# Patient Record
Sex: Female | Born: 1990 | Race: White | Hispanic: No | Marital: Single | Smoking: Former smoker
Health system: Southern US, Community
[De-identification: ages and names within clinical notes are randomized; demographics above are authoritative.]

## PROBLEM LIST (undated history)

## (undated) ENCOUNTER — Inpatient Hospital Stay (HOSPITAL_COMMUNITY): Payer: Self-pay

## (undated) DIAGNOSIS — A499 Bacterial infection, unspecified: Secondary | ICD-10-CM

## (undated) DIAGNOSIS — Z789 Other specified health status: Secondary | ICD-10-CM

## (undated) HISTORY — DX: Bacterial infection, unspecified: A49.9

## (undated) HISTORY — PX: WISDOM TOOTH EXTRACTION: SHX21

## (undated) HISTORY — PX: NO PAST SURGERIES: SHX2092

---

## 2014-01-29 ENCOUNTER — Encounter (HOSPITAL_COMMUNITY): Payer: Self-pay | Admitting: Emergency Medicine

## 2014-01-29 ENCOUNTER — Emergency Department (HOSPITAL_COMMUNITY)
Admission: EM | Admit: 2014-01-29 | Discharge: 2014-01-29 | Disposition: A | Discharge status: Home / Self Care | Payer: BC Managed Care – PPO | Attending: Emergency Medicine | Admitting: Emergency Medicine

## 2014-01-29 ENCOUNTER — Emergency Department (HOSPITAL_COMMUNITY): Payer: BC Managed Care – PPO

## 2014-01-29 DIAGNOSIS — S99929A Unspecified injury of unspecified foot, initial encounter: Principal | ICD-10-CM

## 2014-01-29 DIAGNOSIS — M25561 Pain in right knee: Secondary | ICD-10-CM

## 2014-01-29 DIAGNOSIS — R296 Repeated falls: Secondary | ICD-10-CM | POA: Insufficient documentation

## 2014-01-29 DIAGNOSIS — Y92838 Other recreation area as the place of occurrence of the external cause: Secondary | ICD-10-CM

## 2014-01-29 DIAGNOSIS — Y9343 Activity, gymnastics: Secondary | ICD-10-CM | POA: Insufficient documentation

## 2014-01-29 DIAGNOSIS — Y9239 Other specified sports and athletic area as the place of occurrence of the external cause: Secondary | ICD-10-CM | POA: Insufficient documentation

## 2014-01-29 DIAGNOSIS — S8990XA Unspecified injury of unspecified lower leg, initial encounter: Secondary | ICD-10-CM | POA: Insufficient documentation

## 2014-01-29 DIAGNOSIS — Z3202 Encounter for pregnancy test, result negative: Secondary | ICD-10-CM | POA: Insufficient documentation

## 2014-01-29 DIAGNOSIS — S99919A Unspecified injury of unspecified ankle, initial encounter: Principal | ICD-10-CM

## 2014-01-29 LAB — POC URINE PREG, ED: Preg Test, Ur: NEGATIVE

## 2014-01-29 MED ORDER — HYDROCODONE-ACETAMINOPHEN 5-325 MG PO TABS: 1.0000 | ORAL_TABLET | Freq: Four times a day (QID) | ORAL | Status: DC | PRN

## 2014-01-29 MED ORDER — HYDROCODONE-ACETAMINOPHEN 5-325 MG PO TABS
1.0000 | ORAL_TABLET | Freq: Once | ORAL | Status: AC
  Administered 2014-01-29: 1 via ORAL
  Filled 2014-01-29: qty 1

## 2014-01-29 MED ORDER — IBUPROFEN 600 MG PO TABS: 600.0000 mg | ORAL_TABLET | Freq: Four times a day (QID) | ORAL | Status: DC | PRN

## 2014-01-29 NOTE — ED Provider Notes (Signed)
CSN: [id number]     Arrival date & time 01/29/14  1656 History  This chart was scribed for non-physician practitioner Raymon MuttonMarissa Sciacca, PA-C working with Gwyneth SproutWhitney Plunkett, MD by Joaquin MusicKristina Sanchez-Matthews, ED Scribe. This patient was seen in room TR11C/TR11C and the patient's care was started at 6:08 PM .   Chief Complaint  Patient presents with  . Knee Pain   The history is provided by the patient. No language interpreter was used.   HPI Comments: Tiffany Parsons is a 23 y.o. female who presents to the Emergency Department complaining of constant sharp R knee pain secondary to an injury that occurred yesterday. Pt states she was tumbling in gymnastics and landed wrong; states she landed on both feet but was on uneven surface and fell to her knees; she suspects her knee went outward and states "that is what happened last time". Unable to bear weight due to worsening pain and reports having sharp constant pain around and behind R knee. Hx of prior knee injury while cheerleading as a child; she tore R knee ligaments; denies surgery at the time. Reports having tingling sensation that has subsided. [facility]. Denies taking OTC medications, numbness, and warmth.  History reviewed. No pertinent past medical history. History reviewed. No pertinent past surgical history. No family history on file. History  Substance Use Topics  . Smoking status: Never Smoker   . Smokeless tobacco: Not on file  . Alcohol Use: Not on file   OB History   Grav Para Term Preterm Abortions TAB SAB Ect Mult Living                 Review of Systems  Musculoskeletal: Positive for arthralgias, joint swelling and myalgias.       Knee pain  Skin: Negative for color change, rash and wound.  Neurological: Negative for weakness and numbness.    Allergies  Review of patient's allergies indicates no known allergies.  Home Medications   Prior to Admission medications   Medication Sig Start Date End  Date Taking? Authorizing Mozetta Murfin  HYDROcodone-acetaminophen (NORCO/VICODIN) 5-325 MG per tablet Take 1 tablet by mouth every 6 (six) hours as needed for moderate pain or severe pain. 01/29/14   Marissa Sciacca, PA-C  ibuprofen (ADVIL,MOTRIN) 600 MG tablet Take 1 tablet (600 mg total) by mouth every 6 (six) hours as needed. 01/29/14   Marissa Sciacca, PA-C   BP 123/74  Pulse 104  Temp(Src) 98.2 F (36.8 C) (Oral)  Resp 18  Ht 5\' 7"  (1.702 m)  Wt 125 lb (56.7 kg)  BMI 19.57 kg/m2  SpO2 98%  LMP 01/09/2014  Physical Exam  Nursing note and vitals reviewed. Constitutional: She is oriented to person, place, and time. She appears well-developed and well-nourished. No distress.  HENT:  Head: Normocephalic and atraumatic.  Mouth/Throat: Oropharynx is clear and moist. No oropharyngeal exudate.  Eyes: Conjunctivae and EOM are normal. Right eye exhibits no discharge. Left eye exhibits no discharge.  Neck: Normal range of motion. Neck supple. No tracheal deviation present.  Cardiovascular: Normal rate, regular rhythm and normal heart sounds.  Exam reveals no friction rub.   No murmur heard. Pulses:      Radial pulses are 2+ on the right side, and 2+ on the left side.       Dorsalis pedis pulses are 2+ on the right side, and 2+ on the left side.  Mildly cold upon palpation to the feet bilaterally - negative changes of colors noted. Negative pallor  of skin. Negative cyanosis noted to the lower extremities.    Pulmonary/Chest: Effort normal and breath sounds normal. No respiratory distress. She has no wheezes. She has no rales.  Musculoskeletal: She exhibits edema and tenderness.       Right knee: She exhibits decreased range of motion. She exhibits no swelling, no effusion, no ecchymosis, no deformity, no laceration and no erythema. Tenderness found. LCL tenderness noted.       Legs: Mild swelling identified to the right knee with negative erythema, inflammation, lesions, sores, deformities  identified. Discomfort upon palpation to the lateral collateral ligament, lateral aspect and posterior aspect of the right knee. Diminished flexion secondary to pain. Patient is able to dorsiflex, plantar flex, invert and evert the right ankle without difficulty or ataxia.  Lymphadenopathy:    She has no cervical adenopathy.  Neurological: She is alert and oriented to person, place, and time. No cranial nerve deficit. She exhibits normal muscle tone. Coordination normal.  Cranial nerves III-XII grossly intact Strength 5+/5+ to lower extremities bilaterally with resistance applied, equal distribution noted Strength intact to the digits of the feet bilaterally - mildly diminished to the right side secondary to pain with motion  Sensation intact with differentiation to sharp and dull touch Patient not able to apply weight to the right lower extremity secondary to pain  Skin: Skin is warm and dry. No rash noted. She is not diaphoretic. No erythema.  Psychiatric: She has a normal mood and affect. Her behavior is normal. Thought content normal.    ED Course  Procedures (including critical care time) DIAGNOSTIC STUDIES: Oxygen Saturation is 98% on RA, normal by my interpretation.    COORDINATION OF CARE: 6:15 PM-Discussed treatment plan which includes UA and R knee CT. Will admister pain medication while in the ED if Preg Test is negative. Pt agreed to plan.   Results for orders placed during the hospital encounter of 01/29/14  POC URINE PREG, ED      Result Value Ref Range   Preg Test, Ur NEGATIVE  NEGATIVE   Labs Review Labs Reviewed  POC URINE PREG, ED   Imaging Review Dg Knee Complete 4 Views Right  01/29/2014   CLINICAL DATA:  Right-sided knee pain.  EXAM: RIGHT KNEE - COMPLETE 4+ VIEW  COMPARISON:  No priors.  FINDINGS: Multiple views of the right knee demonstrate no acute displaced fracture, subluxation, dislocation, or soft tissue abnormality.  IMPRESSION: No acute radiographic  abnormality of the right knee.   Electronically Signed   By: Trudie Reed M.D.   On: 01/29/2014 19:49     EKG Interpretation None     MDM   Final diagnoses:  Right knee pain    Medications  HYDROcodone-acetaminophen (NORCO/VICODIN) 5-325 MG per tablet 1 tablet (1 tablet Oral Given 01/29/14 1904)   Filed Vitals:   01/29/14 1716  BP: 123/74  Pulse: 104  Temp: 98.2 F (36.8 C)  TempSrc: Oral  Resp: 18  Height: 5\' 7"  (1.702 m)  Weight: 125 lb (56.7 kg)  SpO2: 98%   I personally performed the services described in this documentation, which was scribed in my presence. The recorded information has been reviewed and is accurate.  Urine pregnancy negative. Right knee plain films negative for acute displaced fracture, subluxation, dislocation or soft tissue abnormality. Discussed case with attending physician, Dr. Salley Scarlet, who recommended patient be placed in knee immobilizer and crutches, follow up with orthopedics as outpatient. Negative focal neurological deficits noted. Patient neurovascularly intact. Pulses  are palpable. Suspicion to be ligamental injury. Patient placed in knee immobilizer with crutches. Patient stable, afebrile. Discharged patient. Discharged patient with medications-discussed course, precautions, disposal technique. Referred patient to orthopedics. Discussed with patient to avoid any physical strenuous activity. Discussed with patient to closely monitor symptoms and if symptoms are to worsen or change to report back to the ED - strict return instructions given.  Patient agreed to plan of care, understood, all questions answered.   Raymon MuttonMarissa Sciacca, PA-C 01/30/14 0414  Raymon MuttonMarissa Sciacca, PA-C 01/30/14 1044

## 2014-01-29 NOTE — Discharge Instructions (Signed)
Please call your doctor for a followup appointment within 24-48 hours. When you talk to your doctor please let them know that you were seen in the emergency department and have them acquire all of your records so that they can discuss the findings with you and formulate a treatment plan to fully care for your new and ongoing problems. Please call and set up an appointment with your primary care provider Please call and set up an appointment with orthopedics-please call tomorrow morning Please keep knee immobilizer at all times-please to not bear weight-please use crutches Please rest, ice, elevate-toes above her nose Please take medications as prescribed-while on pain medications there is to be no drinking alcohol, driving, operating any heavy machinery if there is extra please dispose in a proper manner. Please do not take any extra Tylenol for this can lead to Tylenol overdose and liver issues. Please avoid any physical or strenuous activity Please continue to monitor symptoms closely and if symptoms are to worsen or change (fever greater than 101, chills, chest pain, shortness of breath, difficulty breathing, numbness, tingling, worsening or changes to pain pattern, fall, injury, hot to the touch, loss of sensation) please report back to the ED immediately  Ligament Sprain Ligaments are tough, fibrous tissues that hold bones together at the joints. A sprain can occur when a ligament is stretched. This injury may take several weeks to heal. HOME CARE INSTRUCTIONS   Rest the injured area for as long as directed by your caregiver. Then slowly start using the joint as directed by your caregiver and as the pain allows.  Keep the affected joint raised if possible to lessen swelling.  Apply ice for 15-20 minutes to the injured area every couple hours for the first half day, then 03-04 times per day for the first 48 hours. Put the ice in a plastic bag and place a towel between the bag of ice and your  skin.  Wear any splinting, casting, or elastic bandage applications as instructed.  Only take over-the-counter or prescription medicines for pain, discomfort, or fever as directed by your caregiver. Do not use aspirin immediately after the injury unless instructed by your caregiver. Aspirin can cause increased bleeding and bruising of the tissues.  If you were given crutches, continue to use them as instructed and do not resume weight bearing on the affected extremity until instructed. SEEK MEDICAL CARE IF:   Your bruising, swelling, or pain increases.  You have cold and numb fingers or toes if your arm or leg was injured. SEEK IMMEDIATE MEDICAL CARE IF:   Your toes are numb or blue if your leg was injured.  Your fingers are numb or blue if your arm was injured.  Your pain is not responding to medicines and continues to stay the same or gets worse. MAKE SURE YOU:   Understand these instructions.  Will watch your condition.  Will get help right away if you are not doing well or get worse. Document Released: 07/21/2000 Document Revised: 10/16/2011 Document Reviewed: 05/19/2008 [facility] Patient Information 2015 [place], [place]. This information is not intended to replace advice given to you by your health care provider. Make sure you discuss any questions you have with your health care provider.   Emergency Department Resource Guide 1) Find a Doctor and Pay Out of Pocket Although you won't have to find out who is covered by your insurance plan, it is a good idea to ask around and get recommendations. You will then need to call  the office and see if the doctor you have chosen will accept you as a new patient and what types of options they offer for patients who are self-pay. Some doctors offer discounts or will set up payment plans for their patients who do not have insurance, but you will need to ask so you aren't surprised when you get to your appointment.  2) Contact Your Local  Health Department Not all health departments have doctors that can see patients for sick visits, but many do, so it is worth a call to see if yours does. If you don't know where your local health department is, you can check in your phone book. The CDC also has a tool to help you locate your state's health department, and many state websites also have listings of all of their local health departments.  3) Find a Walk-in Clinic If your illness is not likely to be very severe or complicated, you may want to try a walk in clinic. These are popping up all over the country in pharmacies, drugstores, and shopping centers. They're usually staffed by nurse practitioners or physician assistants that have been trained to treat common illnesses and complaints. They're usually fairly quick and inexpensive. However, if you have serious medical issues or chronic medical problems, these are probably not your best option.  No Primary Care Doctor: - Call Health Connect at  [phone] - they can help you locate a primary care doctor that  accepts your insurance, provides certain services, etc. - Physician Referral Service- [phone]  Chronic Pain Problems: Organization         Address  Phone   Notes  [facility]  [phone] Patients need to be referred by their primary care doctor.   Medication Assistance: Organization         Address  Phone   Notes  [facility] Medication [facility] [address] [place], [place] [address] [phone]) [phone] --Must be a resident of [facility] [place] -- Must have NO insurance coverage whatsoever (no Medicaid/ Medicare, etc.) -- The pt. MUST have a primary care doctor that directs their care regularly and follows them in the community   MedAssist  [phone]   [employer]  [phone]    Agencies that provide inexpensive medical care: Organization         Address  Phone   Notes  [facility]  [phone]) [phone]    [facility]    [phone]) [phone]   [facility] [address] [place], [place] [address] [phone]) [phone]   [facility] [address], [place] [phone]   Planned Parenthood    [phone]   [facility]    [phone]   Community Health and [facility]  [address], [place] Phone:  [phone], Fax:  [phone] Hours of Operation:  9 am - 6 pm, M-F.  Also accepts Medicaid/Medicare and self-pay.  [facility]  [address], [place] Phone: [phone]) [phone], Fax: [phone]. Hours of Operation:  8:30 am - 5:30 pm, M-F.  Also accepts Medicaid and self-pay.  [facility] [place] [address], [place] Phone: [phone]) [phone]   [facility] [address], [place], [place] [phone], Ext. [phone] Mondays & Thursdays: 7-9 AM.  First 15 patients are seen on a first come, first serve basis.  [place] Providers:  Organization         Address  Phone   Notes  [facility] [address], [place] [phone] Also accepts self-pay patients.  [facility] [id number] [place], [place]  [phone]   [place], [address], [place] [phone]   [facility] [address] [place] [phone]   Lucianne Lei [address], [place]   [phone] Only accepts [place] Access [place] patients after they have their name applied to their card.   Self-Pay (no insurance) in [facility]:  Organization         Address  Phone   Notes  Sickle Cell Patients, [facility] Internal [facility] [place] [phone]   [facility] [place] [phone]   [facility] [place]  [place], [place],  [place] [phone]   [facility]/Dr. Osei-Bonsu  [address] [place] or [address], [place] [phone] Phone number for both [place] and [place] locations is the same.  Urgent Medical and [facility] [address] [place] [phone]   [facility] [address] [place] or [address] [phone]   [facility] [address] [place] [phone], phone; [phone], fax Sees patients 1st and 3rd Saturday of every month.  Must not qualify for public or private insurance (i.e. Medicaid, Medicare, [place] Health Choice, Veterans' Benefits)  Household income should be no more than 200% of the poverty level The clinic cannot treat you if you are pregnant or think you are pregnant  Sexually transmitted diseases are not treated at the clinic.    Dental Care: Organization         Address  Phone  Notes  [facility] of [facility] [place] [phone] Accepts children up to age 29 who are enrolled in [place] or [place]; pregnant women with a Medicaid card; and children who have applied for Medicaid or [place] Health Choice, but were declined, whose parents can pay a reduced fee at time of service.  [facility] Department of [facility]  [address], [place] [phone] Accepts children up to age 61 who are enrolled in [place] or [place]; pregnant women with a Medicaid card; and children who have applied for Medicaid or [place] Health Choice, but were declined, whose parents can pay a reduced fee at time of service.  [place] Adult Dental Access PROGRAM  [place] [phone] Patients are seen by appointment only. Walk-ins are not accepted. [place] will see patients 85 years of age and older. Monday - Tuesday (8am-5pm) Most Wednesdays  (8:30-5pm) $30 per visit, cash only  [facility] Adult Dental Access PROGRAM  [address], [facility] [phone] Patients are seen by appointment only. Walk-ins are not accepted. [place] will see patients 52 years of age and older. One Wednesday Evening (Monthly: Volunteer Based).  $30 per visit, cash only  [place]  [phone] for adults; Children under age 67, call Graduate Pediatric Dentistry at [phone]. Children aged 65-14, please call [phone] to request a  pediatric application.  Dental services are provided in all areas of dental care including fillings, crowns and bridges, complete and partial dentures, implants, gum treatment, root canals, and extractions. Preventive care is also provided. Treatment is provided to both adults and children. Patients are selected via a lottery and there is often a waiting list.   [facility] [address] [place]  [phone] [email or web address]   [facility] [address] [place], [place] [phone], Ext. [phone] Second and Fourth Thursday of each month, opens at 6:30 AM; Clinic ends at 9 AM.  Patients are seen on a first-come first-served basis, and a limited number are seen during each clinic.   [facility]  [address] Hillard Danker [place], [place] [phone]   Eligibility Requirements You must have lived in [place], [place], or [place] counties for at least the last three months.   You cannot be eligible for state or federal sponsored [employer], including [employer], [place], or [employer].   You generally cannot be eligible for healthcare insurance through your employer.    How to apply: Eligibility screenings are held every Tuesday and Wednesday afternoon from 1:00 pm until 4:00 pm. You do not need an appointment for the interview!  [facility] [address], [place], [place]   [place]  [place] Department  [place]  [phone]    Behavioral Health Resources in the Community: Intensive Outpatient Programs Organization         Address  Phone  Notes  [place]. [address], [place], [place] [phone]   [facility] Outpatient [address] [place], [place]   ADS: Alcohol & Drug Svcs [address] [place], [place]   [place] [address]  [place], [place] or [phone]   Substance Abuse Resources Organization         Address  Phone  Notes  Alcohol and Drug Services  [phone]   [place]  [phone]   The [place]   Chinita Pester  [phone]   Residential & Outpatient Substance Abuse Program  5166679660   Psychological Services Organization         Address  Phone  Notes  [facility]  [place]  [phone]   [place] [address], [place] or [phone]    Mobile Crisis Teams Organization         Address  Phone  Notes  [facility], Mobile Crisis Care Unit  [phone]   [facility] Psychotherapeutic Services  [address] [place], [place]   [place] Levels [address], [place] [phone]    Self-Help/Support Groups Organization         Address  Phone             Notes  [place]. of [place] - variety of support groups  [place] Call for more information  Narcotics Anonymous (NA), Caring [facility] [address], [employer] [place]  2 meetings at this location   Special educational needs teacher         Address  Phone  Notes  [facility] Residential Treatment [place],    [place]  [place]  [place], [place],  [place], [place]   [place], [place] [phone] Admissions: 8am-3pm M-F  Incentives Substance [place] [address],    [place], [place] [phone]   [facility] [address] [place], [place], [place]   [facility] [address]  [place], [place]   [facility]ight Programs - Intensive Outpatient [address], [place], [place]   [facility] ([facility] [address] [place].,  [place], [place] [phone] or [phone]   [facility]idential Treatment Services (RTS) [address], [place], [place] Accepts Medicaid  [facility] [place] [address],  [place] [phone] Substance Abuse/Addiction Treatment   [facility] Organization         Address  Phone  Notes  [facility]  [phone]   Domenic Schwab, PhD [address] Arlis Porta [place], [place]   [phone] or [phone]   Gandy Rotonda [place], [place] [phone]   [facility] [address] [place], [place] [phone] Insurance/Medicaid/sponsorship through [facility] and Families [address], [address] [place]                                    [place], [place] [phone] [place] [address] [place], [place] [phone]    Dr. Adele Schilder  [phone]   [facility] Dept. 1) [address] [place] 2) [place] 3)  [place] 65, [place] [phone]  [phone]   [place] [phone] or [phone] (After Hours)

## 2014-01-29 NOTE — ED Notes (Signed)
Pt instructed on crutch and knee immobilizer use.

## 2014-01-29 NOTE — ED Notes (Addendum)
Pt made a bad landing fm tumbling gymnastics event. Last night about 11pm. Pt states that it felt like her L knee twisted when she landed on grass after a "roundout". Initially able to walk  but pain progressivly worse until unable to bear weight at this time.  Pedal pulses weaker in R foot than L--- and L foot is cooler than Right

## 2014-01-30 NOTE — ED Provider Notes (Signed)
Medical screening examination/treatment/procedure(s) were performed by non-physician practitioner and as supervising physician I was immediately available for consultation/collaboration.   EKG Interpretation None        Gwyneth SproutWhitney Plunkett, MD 01/30/14 1400

## 2014-04-26 ENCOUNTER — Ambulatory Visit (INDEPENDENT_AMBULATORY_CARE_PROVIDER_SITE_OTHER): Payer: BC Managed Care – PPO | Admitting: Family Medicine

## 2014-04-26 VITALS — BP 124/82 | HR 75 | Temp 97.9°F | Resp 20 | Ht 66.0 in | Wt 129.4 lb

## 2014-04-26 DIAGNOSIS — M25559 Pain in unspecified hip: Secondary | ICD-10-CM

## 2014-04-26 DIAGNOSIS — R112 Nausea with vomiting, unspecified: Secondary | ICD-10-CM

## 2014-04-26 LAB — POCT WET PREP WITH KOH
KOH Prep POC: NEGATIVE
Trichomonas, UA: NEGATIVE
Yeast Wet Prep HPF POC: NEGATIVE

## 2014-04-26 MED ORDER — METRONIDAZOLE 500 MG PO TABS: 500.0000 mg | ORAL_TABLET | Freq: Three times a day (TID) | ORAL | Status: DC

## 2014-04-26 NOTE — Progress Notes (Addendum)
Patient ID: Tiffany Parsons MRN: [id number], DOB: 1991-05-06, 23 y.o. Date of Encounter: 04/26/2014, 12:32 PM  This chart was scribed for Dr. Elvina Sidle, MD by Jarvis Morgan, Medical Scribe. This patient was seen in Room 5 and the patient's care was started at 12:35 PM.  Primary Physician: No PCP Per Patient  Chief Complaint:  Chief Complaint  Patient presents with   Vaginitis   Nausea   Cyst    right wrist     HPI: 23 y.o. year old female with history below presents with an ongoing vaginal infection. Pt states that she has had bacterial vaginitis before and now it has returned. Her last infection was two months ago. Pt has previously been treated with Flagyl and states that it cleared up her symptoms. She is having associated nausea, vomiting, pelvic pain and difficulty sleeping due to pain. She believes it to be hormonal and due to her [facility] medication. She began it 3 months ago and states she is going to follow up with gynecologist to possibly change medications. She notes she stopped taking her BC medication yesterday. She is sexually active. She denies any new sexual partners. She denies any vaginal discharge, shortness of breath, chest pain, dysuria, frequency, dizziness, or fever.  Student at [employer]    History reviewed. No pertinent past medical history.   Home Meds: Prior to Admission medications   Medication Sig Start Date End Date Taking? Authorizing Provider  Norethin Ace-Eth Estrad-FE (MINASTRIN 24 FE) 1-20 MG-MCG(24) CHEW Chew 1 tablet by mouth daily.   Yes Historical Provider, MD    Allergies: No Known Allergies  History   Social History   Marital Status: Single    Spouse Name: N/A    Number of Children: N/A   Years of Education: N/A   Occupational History   Not on file.   Social History Main Topics   Smoking status: Never Smoker    Smokeless tobacco: Never Used   Alcohol Use: Yes   Drug Use: No   Sexual Activity: Not on  file   Other Topics Concern   Not on file   Social History Narrative   No narrative on file     Review of Systems: Constitutional: negative for chills, fever, night sweats, weight changes, or fatigue  HEENT: negative for vision changes, hearing loss, congestion, rhinorrhea, ST, epistaxis,cough, or sinus pressure Cardiovascular: negative for chest pain or palpitations Respiratory: negative for hemoptysis, wheezing, shortness of breath, or cough Abdominal: negative for abdominal pain, diarrhea, or constipation. Positive for nausea or vomiting Dermatological: negative for rash Neurologic: negative for headache, dizziness, or syncope GU: negative for hematuria, frequency, dysuria or vaginal discharge. Positive for pelvic pain. All other systems reviewed and are otherwise negative with the exception to those above and in the HPI.   Physical Exam: Blood pressure 124/82, pulse 75, temperature 97.9 F (36.6 C), temperature source Oral, resp. rate 20, height  (1.676 m), weight 129 lb 6 oz (58.684 kg), last menstrual period 04/05/2014, SpO2 100.00%., Body mass index is 20.89 kg/(m^2). General: Well developed, well nourished, in no acute distress. Head: Normocephalic, atraumatic, eyes without discharge, sclera non-icteric, nares are without discharge. Bilateral auditory canals clear, TM's are without perforation, pearly grey and translucent with reflective cone of light bilaterally. Oral cavity moist, posterior pharynx without exudate, erythema, peritonsillar abscess, or post nasal drip.  Neck: Supple. No thyromegaly. Full ROM. No lymphadenopathy. Lungs: Clear bilaterally to auscultation without wheezes, rales, or rhonchi. Breathing is unlabored.  Heart: RRR with S1 S2. No murmurs, rubs, or gallops appreciated. Abdomen: Soft, non-tender, non-distended with normoactive bowel sounds. No hepatomegaly. No rebound/guarding. No obvious abdominal masses. Pelvic:  NEFG, bloody thick mucoid discharge  from os with friable os.  Mildly tender UTX, no adenxal pain  Msk:  Strength and tone normal for age. Extremities/Skin: Warm and dry. No clubbing or cyanosis. No edema. No rashes or suspicious lesions. Neuro: Alert and oriented X 3. Moves all extremities spontaneously. Gait is normal. CNII-XII grossly in tact. Psych:  Responds to questions appropriately with a normal affect.   Labs: Results for orders placed in visit on 04/26/14  POCT WET PREP WITH KOH      Result Value Ref Range   Trichomonas, UA Negative     Clue Cells Wet Prep HPF POC 3-5     Epithelial Wet Prep HPF POC 2-4     Yeast Wet Prep HPF POC neg     Bacteria Wet Prep HPF POC 2+     RBC Wet Prep HPF POC 0-2     WBC Wet Prep HPF POC 3-6     KOH Prep POC Negative      ASSESSMENT AND PLAN:  23 y.o. year old female with  Pain in joint, pelvic region and thigh, unspecified laterality - Plan: POCT Wet Prep with KOH, GC/Chlamydia Amp Probe, Genital, metroNIDAZOLE (FLAGYL) 500 MG tablet  Nausea and vomiting, vomiting of unspecified type - Plan: metroNIDAZOLE (FLAGYL) 500 MG tablet   Signed, Elvina Sidle, MD 04/26/2014 12:32 PM

## 2014-04-26 NOTE — Patient Instructions (Signed)

## 2014-04-28 LAB — GC/CHLAMYDIA PROBE AMP
CT Probe RNA: NEGATIVE
GC Probe RNA: NEGATIVE

## 2014-05-12 ENCOUNTER — Ambulatory Visit (INDEPENDENT_AMBULATORY_CARE_PROVIDER_SITE_OTHER): Payer: BC Managed Care – PPO | Admitting: Family Medicine

## 2014-05-12 ENCOUNTER — Encounter: Payer: Self-pay | Admitting: Family Medicine

## 2014-05-12 VITALS — BP 108/80 | HR 67 | Temp 98.3°F | Ht 66.75 in | Wt 129.0 lb

## 2014-05-12 DIAGNOSIS — Z7189 Other specified counseling: Secondary | ICD-10-CM

## 2014-05-12 DIAGNOSIS — A499 Bacterial infection, unspecified: Secondary | ICD-10-CM

## 2014-05-12 DIAGNOSIS — N760 Acute vaginitis: Secondary | ICD-10-CM

## 2014-05-12 DIAGNOSIS — Z7689 Persons encountering health services in other specified circumstances: Secondary | ICD-10-CM

## 2014-05-12 DIAGNOSIS — M67431 Ganglion, right wrist: Secondary | ICD-10-CM

## 2014-05-12 DIAGNOSIS — B9689 Other specified bacterial agents as the cause of diseases classified elsewhere: Secondary | ICD-10-CM

## 2014-05-12 MED ORDER — METRONIDAZOLE 0.75 % VA GEL: 1.0000 | Freq: Two times a day (BID) | VAGINAL | Status: DC

## 2014-05-12 NOTE — Progress Notes (Signed)
No chief complaint on file.   HPI:  Tiffany Parsons is here to establish care.  Last PCP and physical: has a gynecologist  Has the following chronic problems and concerns today:  There are no active problems to display for this patient.  BV: -dx at urgent care a few weeks ago -had neg testing for everything else, has mild irritation, no discharge -denies: fevers, NVD, pelvic or vag pain, irr bleeding, new sexual partner -she took a few of the flagyl but did not repeat course -FDLMP: 2 weeks  Cyst on R wrist: -started bothering her a few weeks ago -pain in this area at times, bumps it on things  ROS negative for unless reported above: fevers, unintentional weight loss, hearing or vision loss, chest pain, palpitations, struggling to breath, hemoptysis, melena, hematochezia, hematuria, falls, loc, si, thoughts of self harm  Past Medical History  Diagnosis Date  . Bacterial infection     Family History  Problem Relation Age of Onset  . Hyperlipidemia Mother     History   Social History  . Marital Status: Single    Spouse Name: N/A    Number of Children: N/A  . Years of Education: N/A   Social History Main Topics  . Smoking status: Former Games developermoker  . Smokeless tobacco: Never Used     Comment: smoked lightly in the past  . Alcohol Use: Yes     Comment: 5 drinks; 2-3 times per week  . Drug Use: No  . Sexual Activity: None   Other Topics Concern  . None   Social History Narrative   Work or School: works at [employer]; [employer]      Home Situation: lives with [place]      Spiritual Beliefs: Christian      Lifestyle: walks a lot; diet is healthy             Current outpatient prescriptions:metroNIDAZOLE (METROGEL VAGINAL) 0.75 % vaginal gel, Place 1 Applicatorful vaginally 2 (two) times daily., Disp: 70 g, Rfl: 0  EXAM:  Filed Vitals:   05/12/14 0923  BP: 108/80  Pulse: 67  Temp: 98.3 F (36.8 C)    Body mass index is 20.37  kg/(m^2).  GENERAL: vitals reviewed and listed above, alert, oriented, appears well hydrated and in no acute distress  HEENT: atraumatic, conjunttiva clear, no obvious abnormalities on inspection of external nose and ears  NECK: no obvious masses on inspection  LUNGS: clear to auscultation bilaterally, no wheezes, rales or rhonchi, good air movement  CV: HRRR, no peripheral edema  MS: moves all extremities without noticeable abnormality Subcutaneous nodule on R flexor wrist.  PSYCH: pleasant and cooperative, no obvious depression or anxiety  ASSESSMENT AND PLAN:  Discussed the following assessment and plan:  Ganglion cyst of wrist, right -likely symptomatic ganglion cyst -discussed options and she would like to see specialist to attempt aspiration, surgical treatment if recurs -referral placed  Encounter to establish care  Bacterial vaginitis -likely, discussed likely BV and other potential etiologies -she declined repeat pelvic exam today -she opted for empiric PV tx given recent negative testing for other etiologies, follow up if persists  -We reviewed the PMH, PSH, FH, SH, Meds and Allergies. -We provided refills for any medications we will prescribe as needed. -We addressed current concerns per orders and patient instructions. -We have asked for records for pertinent exams, studies, vaccines and notes from previous providers. -We have advised patient to follow up per instructions below.   -Patient  advised to return or notify a doctor immediately if symptoms worsen or persist or new concerns arise.  Patient Instructions  -We placed a referral for you as discussed to the Sports Medicine doctor for your wrist pain. It usually takes about 1-2 weeks to process and schedule this referral. If you have not heard from [place] regarding this appointment in 2 weeks please contact our office.  -Please use the metronidazole gel twice daily for 1 week. Follow up if any persistent  symptoms.      Kriste Basque R.

## 2014-05-12 NOTE — Progress Notes (Signed)
Pre visit review using our clinic review tool, if applicable. No additional management support is needed unless otherwise documented below in the visit note. 

## 2014-05-12 NOTE — Patient Instructions (Signed)
-  We placed a referral for you as discussed to the Sports Medicine doctor for your wrist pain. It usually takes about 1-2 weeks to process and schedule this referral. If you have not heard from us regarding this appointment in 2 weeks please contact our office.  -Please use the metronidazole gel twice daily for 1 week. Follow up if any persistent symptoms.

## 2014-05-19 ENCOUNTER — Ambulatory Visit (INDEPENDENT_AMBULATORY_CARE_PROVIDER_SITE_OTHER): Payer: BC Managed Care – PPO | Admitting: Family Medicine

## 2014-05-19 ENCOUNTER — Encounter: Payer: Self-pay | Admitting: Family Medicine

## 2014-05-19 ENCOUNTER — Other Ambulatory Visit (INDEPENDENT_AMBULATORY_CARE_PROVIDER_SITE_OTHER): Payer: BC Managed Care – PPO

## 2014-05-19 VITALS — BP 110/72 | HR 75 | Ht 66.0 in | Wt 129.0 lb

## 2014-05-19 DIAGNOSIS — M25531 Pain in right wrist: Secondary | ICD-10-CM

## 2014-05-19 DIAGNOSIS — M67439 Ganglion, unspecified wrist: Secondary | ICD-10-CM | POA: Insufficient documentation

## 2014-05-19 DIAGNOSIS — M67431 Ganglion, right wrist: Secondary | ICD-10-CM

## 2014-05-19 NOTE — Patient Instructions (Signed)
Good to meet you Ibuprofen 600mg  in 6 hours.  Iece 20 minutest at the end of the day sn May come back but hopefully not for a very long time.  Checkin in 2-3 weeks.

## 2014-05-19 NOTE — Progress Notes (Signed)
Tawana ScaleZach Brayley Mackowiak D.O. [facility] [address] [place], [place] [address] Phone: [phone] Subjective:    I'm seeing this patient by the request  of:  Kriste BasqueKIM, HANNAH R., DO   CC: Right wrist cyst  KVQ:[id number]:Subjective Tiffany Parsons is a 23 y.o. female coming in with complaint of cyst on her right wrist. Patient was seen by primary care provider who sent her here for further evaluation. Patient states that she has had this from multiple years but now it seems to be giving her aggravation. Patient states it is on the volar aspect of the wrist. Worse when she does repetitive activity. Denies any fevers chills or any abnormal weight loss. Denies any weakness or numbness in the fingers. States that the severity is 5/10. Patient would like it removed if possible. No nighttime awakening.     Past medical history, social, surgical and family history all reviewed in electronic medical record.   Review of Systems: No headache, visual changes, nausea, vomiting, diarrhea, constipation, dizziness, abdominal pain, skin rash, fevers, chills, night sweats, weight loss, swollen lymph nodes, body aches, joint swelling, muscle aches, chest pain, shortness of breath, mood changes.   Objective Blood pressure 110/72, pulse 75, height 5\' 6"  (1.676 m), weight 129 lb (58.514 kg), last menstrual period 04/29/2014, SpO2 99.00%.  General: No apparent distress alert and oriented x3 mood and affect normal, dressed appropriately.  HEENT: Pupils equal, extraocular movements intact  Respiratory: Patient's speak in full sentences and does not appear short of breath  Cardiovascular: No lower extremity edema, non tender, no erythema  Skin: Warm dry intact with no signs of infection or rash on extremities or on axial skeleton.  Abdomen: Soft nontender  Neuro: Cranial nerves II through XII are intact, neurovascularly intact in all extremities with 2+ DTRs and 2+ pulses.  Lymph: No lymphadenopathy of posterior or anterior  cervical chain or axillae bilaterally.  Gait normal with good balance and coordination.  MSK:  Non tender with full range of motion and good stability and symmetric strength and tone of shoulders, elbows, wrist, hip, knee and ankles bilaterally.  Patient hand exam shows the patient does have a ganglion cyst right over the lunate bone on the volar aspect of the wrist. Full grip strength and neurovascularly intact packed distally. Full range of motion of the wrist. Symmetric to contralateral side.  MSK US performed of: Right wrist This study was ordered, performed, and interpreted by Terrilee FilesZach Erick Oxendine D.O.  Wrist: All extensor compartments visualized and tendons all normal in appearance without fraying, tears, or sheath effusions. No effusion seen. TFCC intact. Scapholunate ligament intact. Patient does have what appears to be a ganglion cyst definitely 0.4 cm in diameter. Carpal tunnel visualized and median nerve area normal, flexor tendons all normal in appearance without fraying, tears, or sheath effusions. Power doppler signal normal.  IMPRESSION:  Ganglion cyst  Procedure: Real-time Ultrasound Guided aspiration and Injection of right volar ganglion cyst Device: GE Logiq E  Ultrasound guided injection is preferred based studies that show increased duration, increased effect, greater accuracy, decreased procedural pain, increased response rate, and decreased cost with ultrasound guided versus blind injection.  Verbal informed consent obtained.  Time-out conducted.  Noted no overlying erythema, induration, or other signs of local infection.  Skin prepped in a sterile fashion.  Local anesthesia: Topical Ethyl chloride.  With sterile technique and under real time ultrasound guidance:  With a 25-gauge 1 inch needle was injected with 1 cc of 0.5% Marcaine. Patient then  with pressure did have significant gel-like fluid removed from the ganglion cyst. Patient then had 0.5 cc of Kenalog 40 mg/dL  injected into the cyst itself. Patient tolerated the procedure well with minimal blood loss. Completed without difficulty  Pain immediately resolved suggesting accurate placement of the medication.  Advised to call if fevers/chills, erythema, induration, drainage, or persistent bleeding.  Images permanently stored and available for review in the ultrasound unit.  Impression: Technically successful ultrasound guided injection.     Impression and Recommendations:     This case required medical decision making of moderate complexity.

## 2014-05-19 NOTE — Assessment & Plan Note (Signed)
Patient had successful aspiration and injection done today. We discussed that 50% of these like to reaccumulate over time. We discussed an icing regimen as well as monitoring for any reaccumulation or any signs of infection. Patient likely will do very well though. We discussed taking coming back again in 2 weeks for further evaluation to make sure everything is healing appropriately. The patient otherwise can come back and see me again if any reaccumulation occurs we can attempt aspiration again.

## 2015-04-13 ENCOUNTER — Ambulatory Visit (INDEPENDENT_AMBULATORY_CARE_PROVIDER_SITE_OTHER): Payer: BC Managed Care – PPO | Admitting: Family Medicine

## 2015-04-13 ENCOUNTER — Encounter: Payer: Self-pay | Admitting: Family Medicine

## 2015-04-13 VITALS — BP 112/80 | HR 82 | Temp 97.8°F | Ht 66.0 in | Wt 126.6 lb

## 2015-04-13 DIAGNOSIS — F411 Generalized anxiety disorder: Secondary | ICD-10-CM | POA: Diagnosis not present

## 2015-04-13 DIAGNOSIS — F331 Major depressive disorder, recurrent, moderate: Secondary | ICD-10-CM | POA: Diagnosis not present

## 2015-04-13 DIAGNOSIS — F410 Panic disorder [episodic paroxysmal anxiety] without agoraphobia: Secondary | ICD-10-CM | POA: Diagnosis not present

## 2015-04-13 MED ORDER — PAROXETINE HCL 20 MG PO TABS: 20.0000 mg | ORAL_TABLET | Freq: Every day | ORAL | Status: DC

## 2015-04-13 MED ORDER — LORAZEPAM 0.5 MG PO TABS: 0.5000 mg | ORAL_TABLET | Freq: Two times a day (BID) | ORAL | Status: DC | PRN

## 2015-04-13 NOTE — Progress Notes (Addendum)
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  -reports uses xanax 1/2 tablet (unsure of dose) every other day and feels when she stops it panic attacks are worse and she feels like she is addicted -reports she finally told her father about this and he told her to see me for help  -reports she wants to get off xanax  -denies: SI, thoughts of self harm, manic symptoms now or in the past -reports on lexapro in the past, but did not like the way it made her feel -denies regular alcohol use or other drug use - drinks some on weekends -denies pregnancy, [facility] 16t[facility]  ROS: See pertinent positives and negatives per HPI.  Past Medical History  Diagnosis Date  . Bacterial infection     Past Surgical History  Procedure Laterality Date  . Wisdom tooth extraction      Family History  Problem Relation Age of Onset  . Hyperlipidemia Mother     Social History   Social History  . Marital Status: Single    Spouse Name: N/A  . Number of Children: N/A  . Years of Education: N/A   Social History Main Topics  . Smoking status: Former Games developer  . Smokeless tobacco: Never Used     Comment: smoked lightly in the past  . Alcohol Use: Yes     Comment: 5 drinks; 2-3 times per week  . Drug Use: No  . Sexual Activity: Not Asked   Other Topics Concern  . None   Social History Narrative   Work or School: works at [employer]; [employer] Situation: lives with [place]      Spiritual  Beliefs: Christian      Lifestyle: walks a lot; diet is healthy              Current outpatient prescriptions:  .  LORazepam (ATIVAN) 0.5 MG tablet, Take 1 tablet (0.5 mg total) by mouth every 12 (twelve) hours as needed for anxiety (panic attack). Try to use as little as possible., Disp: 15 tablet, Rfl: 0 .  PARoxetine (PAXIL) 20 MG tablet, Take 1 tablet (20 mg total) by mouth daily., Disp: 30 tablet, Rfl: 3  EXAM:  Filed Vitals:   04/13/15 1340  BP: 112/80  Pulse: 82  Temp: 97.8 F (36.6 C)    Body mass index is 20.44 kg/(m^2).  GENERAL: vitals reviewed and listed above, alert, oriented, appears well hydrated and in no acute distress  HEENT: atraumatic, conjunttiva clear, no obvious abnormalities on inspection of external nose and ears  NECK: no obvious masses on inspection  LUNGS: clear to auscultation bilaterally, no wheezes, rales or rhonchi, good air movement  CV: HRRR, no peripheral edema  MS: moves all extremities without noticeable abnormality  PSYCH: pleasant and cooperative, no obvious depression or anxiety  ASSESSMENT AND PLAN:  Discussed the following assessment and plan:  MDD (major depressive disorder), recurrent episode, moderate  Generalized anxiety disorder  Panic disorder  -discussed options -opted for CBT, add  paxil for MDD, GAD, Panic disorder -cont low dose benzo for now but change to ativan - advised I DO NOT prescribe this for long term use -counseled on risks, interactions, interactions with alcohol and advised not to use alcohol with benzos -she wants to try to get off and once doing ok on Paxil and CBT will attempt slow taper -she agrees to see psych if any worsening, difficulty getting of benzo or not improving -Patient advised to return or notify a doctor immediately if symptoms worsen or persist or new concerns arise.  Patient Instructions  BEFORE YOU LEAVE: -schedule follow up in 1 month  Call today to schedule an appointment  for counseling.  Start the Paxil - once daily today. Do not stop this medication suddenly.  Stop taking xanax. Please use the Ativan instead only per current xanax use and/or as needed for panic. We will plan to slowly taper this medication once you are feeling better.  You have decided to try a medication that is a controlled substance (Ativan) for treatment of your medical problem. While this medication has benefits and can help you with your illness, as we discussed, it also has considerable risks. You have been properly informed of the risks and alternatives and re-iterate here that this medication can cause:  Altered Mental Capacity or Alertness: Do not drive or operate machinery while taking this medication  Dependence: A physiological state that can occur with regular drug use and results in withdrawal symptoms when drug use is abruptly discontinued. This can happen even with a short course of this medication.  Addiction: A chronic, relapsing disease characterized by compulsive drug-seeking and use despite negative consequences and by long-lasting changes in the brain.  Many, many more side effects including some that can be serious and/or life threatening are possible with improper use. Please discuss these side effects with your pharmacist.  I advise: -that you use as little of this medication as possible and only as directed for as short of time as possible -keep this medication in a safe and locked location away from children or others -do not share this medication -dispose of any unused medication in a medication drop box -do not take this medication with other controlled or sedating substances, alcohol or drugs other then those approved by your doctor       Kriste Basque R.

## 2015-04-13 NOTE — Patient Instructions (Signed)
BEFORE YOU LEAVE: -schedule follow up in 1 month  Call today to schedule an appointment for counseling.  Start the Paxil - once daily today. Do not stop this medication suddenly.  Stop taking xanax. Please use the Ativan instead only per current xanax use and/or as needed for panic. We will plan to slowly taper this medication once you are feeling better.  You have decided to try a medication that is a controlled substance (Ativan) for treatment of your medical problem. While this medication has benefits and can help you with your illness, as we discussed, it also has considerable risks. You have been properly informed of the risks and alternatives and re-iterate here that this medication can cause:  Altered Mental Capacity or Alertness: Do not drive or operate machinery while taking this medication  Dependence: A physiological state that can occur with regular drug use and results in withdrawal symptoms when drug use is abruptly discontinued. This can happen even with a short course of this medication.  Addiction: A chronic, relapsing disease characterized by compulsive drug-seeking and use despite negative consequences and by long-lasting changes in the brain.  Many, many more side effects including some that can be serious and/or life threatening are possible with improper use. Please discuss these side effects with your pharmacist.  I advise: -that you use as little of this medication as possible and only as directed for as short of time as possible -keep this medication in a safe and locked location away from children or others -do not share this medication -dispose of any unused medication in a medication drop box -do not take this medication with other controlled or sedating substances, alcohol or drugs other then those approved by your doctor

## 2015-04-13 NOTE — Progress Notes (Signed)
Pre visit review using our clinic review tool, if applicable. No additional management support is needed unless otherwise documented below in the visit note. 

## 2015-05-13 ENCOUNTER — Ambulatory Visit: Payer: BC Managed Care – PPO | Admitting: Family Medicine

## 2015-12-08 ENCOUNTER — Telehealth: Payer: Self-pay | Admitting: *Deleted

## 2015-12-08 NOTE — Telephone Encounter (Signed)
Attempted to contact patient again to call office back.

## 2015-12-08 NOTE — Telephone Encounter (Signed)
Agree. Needs urgent evaluation in ER and then psychiatry/rehab help after.

## 2015-12-08 NOTE — Telephone Encounter (Signed)
Patient has yet to arrive to ED (atleast not a [place] ED). Left voicemail to call office back. Will recommend patient go to [facility] ED for medical clearance and evaluation. --------------------------------------------------------------------------------[facility]--------------------------------------------------------------------------------- PLEASE NOTE: All timestamps contained within this report are represented as [place] Standard Time. CONFIDENTIALTY NOTICE: This fax transmission is intended only for the addressee. It contains information that is legally privileged, confidential or otherwise protected from use or disclosure. If you are not the intended recipient, you are strictly prohibited from reviewing, disclosing, copying using or disseminating any of this information or taking any action in reliance on or regarding this information. If you have received this fax in error, please notify us immediately by telephone so that we can arrange for its return to us. Phone: [phone], Toll-Free: [phone], Fax: [phone] Page: 1 of 2 Call Id: [id number] [facility] Night - Client TELEPHONE ADVICE RECORD [facility] Patient Name: Tiffany DevoidMILY Melchior Gender: Female DOB: 07/06/1991 Age: 25 Y 7 M 24 D Return Phone Number: [phone] (Primary) Address: City/State/Zip: [place] Client [facility] Night - Client Client Site [facility] - Night Physician Kriste BasqueKim, Hannah - MD Contact Type Call Who Is Calling Patient / Member / Family / Caregiver Call Type Triage / Clinical Caller Name Graylon GoodBilly Vanaken Relationship To Patient Father Return Phone Number [phone] (Primary) Chief Complaint Substance Abuse And Dependence Reason for Call Symptomatic / Request for Health Information Initial Comment Caller states my dtr is having withdraw from alcohol and zanex she needs to be put in a program PreDisposition Go to ED Translation  No Nurse Assessment Nurse: Orvis Brillowland, RN, Olegario MessierKathy Date/Time (Eastern Time): 12/07/2015 5:26:18 PM Confirm and document reason for call. If symptomatic, describe symptoms. You must click the next button to save text entered. ---Caller states that his dtr is withdrawing from ETOH & Xanaxes, was seen in ED in [place] on Sunday night because pt was having seizures without any history of them, Early Monday AM ED gave pt 2 Xanaxes. Last drank ETOH on Sunday. Caller states that his dtr is wanting to get into a drug recovery program. Caller states that his dtr has been using Xanaxes for past 3 yrs & has been drinking daily "for a while". Has the patient traveled out of the country within the last 30 days? ---No Does the patient have any new or worsening symptoms? ---Yes Will a triage be completed? ---Yes Related visit to physician within the last 2 weeks? ---Yes Does the PT have any chronic conditions? (i.e. diabetes, asthma, etc.) ---Yes List chronic conditions. ---polysubstance abuse Is the patient pregnant or possibly pregnant? (Ask all females between the ages of 512-55) ---No Is this a behavioral health or substance abuse call? ---Yes Are you having any thoughts or feelings of harming or killing yourself or someone else? ---No Are you currently experiencing any physical discomfort that you think may be related to the use of alcohol or other drugs? ---Yes PLEASE NOTE: All timestamps contained within this report are represented as [place] Standard Time. CONFIDENTIALTY NOTICE: This fax transmission is intended only for the addressee. It contains information that is legally privileged, confidential or otherwise protected from use or disclosure. If you are not the intended recipient, you are strictly prohibited from reviewing, disclosing, copying using or disseminating any of this information or taking any action in reliance on or regarding this information. If you have received this fax in  error, please notify us immediately by telephone so that we can arrange  for its return to [place]. Phone: [phone], Toll-Free: [phone], Fax: [phone] Page: 2 of 2 Call Id: [id number] Nurse Assessment (use substance abuse or alcohol abuse guidelines. These include withdrawal symptoms) Do you worry that you may be hearing or seeing things that others do not? ---No Do you take medications for your condition(s)? ---No Guidelines Guideline Title Affirmed Question Affirmed Notes Nurse Date/Time Lamount Cohen Time) Substance Abuse and Dependence Bizarre, paranoid or confused behavior Pearline Cables 12/07/2015 5:30:27 PM Disp. Time Lamount Cohen Time) Disposition Final User 12/07/2015 5:34:09 PM Go to ED Now Yes Orvis Brill, RN, Sammuel Bailiff Understands: Yes Disagree/Comply: Comply Care Advice Given Per Guideline GO TO ED NOW: You need to be seen in the Emergency Department. Go to the ER at ___________ Hospital. Leave now. Drive carefully. BRING MEDICINES: * Please bring a list of your current medicines when you go to the Emergency Department (ER). * It is also a good idea to bring the pill bottles too. This will help the doctor to make certain you are taking the right medicines and the right dose. DRIVING: Another adult should drive. CARE ADVICE given per Substance Abuse and Dependence (Adult) guideline. Comments User: Almira Bar, RN Date/Time (Eastern Time): 12/07/2015 5:36:11 PM Caller advised that he will also need to call his insurance provider to determine what rehab facilities his insurance covers. Caller states that his dtr advised him that she doesn't think that she needs to go back to ED but caller advised that since his dtr is also withdrawing from ETOH & has H/O drinking daily, she is also at high risk for having ETOH withdrawal seizures. Referrals [facility] - ED

## 2015-12-13 NOTE — Telephone Encounter (Signed)
Attempted to call patient and message on voicemail was that mail box is full.  Will have to try and call patient later.

## 2015-12-14 NOTE — Telephone Encounter (Signed)
Tried to contact patient no voicemail, tried to call mother no voicemail, left message on Tiffany Parsons (fathers) phone.

## 2016-06-29 ENCOUNTER — Encounter (HOSPITAL_COMMUNITY): Payer: Self-pay | Admitting: *Deleted

## 2016-06-29 ENCOUNTER — Emergency Department (HOSPITAL_COMMUNITY)
Admission: EM | Admit: 2016-06-29 | Discharge: 2016-06-29 | Disposition: A | Discharge status: Home / Self Care | Payer: BC Managed Care – PPO | Attending: Dermatology | Admitting: Dermatology

## 2016-06-29 DIAGNOSIS — F10129 Alcohol abuse with intoxication, unspecified: Secondary | ICD-10-CM | POA: Insufficient documentation

## 2016-06-29 DIAGNOSIS — Z5321 Procedure and treatment not carried out due to patient leaving prior to being seen by health care provider: Secondary | ICD-10-CM | POA: Diagnosis not present
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
## 2016-06-29 NOTE — ED Notes (Signed)
Pt stated there was nothing I could do to convince her to stay. I in formed Pt that she had a head laceration, and that she may have unknown head trauma, and she is at critical risk. I informed pt that death is a possibility pt stated "I don't believe that", "i still want to leave", and "i'm a medical student, and I want to leave."

## 2016-07-25 ENCOUNTER — Ambulatory Visit: Payer: BC Managed Care – PPO | Admitting: Family Medicine

## 2017-03-21 ENCOUNTER — Encounter: Payer: Self-pay | Admitting: Family Medicine

## 2017-03-21 ENCOUNTER — Ambulatory Visit (INDEPENDENT_AMBULATORY_CARE_PROVIDER_SITE_OTHER): Payer: BC Managed Care – PPO | Admitting: Family Medicine

## 2017-03-21 VITALS — BP 124/88 | HR 88 | Temp 98.7°F | Wt 139.1 lb

## 2017-03-21 DIAGNOSIS — R300 Dysuria: Secondary | ICD-10-CM | POA: Diagnosis not present

## 2017-03-21 LAB — POCT URINALYSIS DIPSTICK
BILIRUBIN UA: NEGATIVE
Glucose, UA: NEGATIVE
Ketones, UA: NEGATIVE
Nitrite, UA: NEGATIVE
PH UA: 8 (ref 5.0–8.0)
Protein, UA: POSITIVE
Spec Grav, UA: 1.01 (ref 1.010–1.025)
Urobilinogen, UA: 0.2 E.U./dL

## 2017-03-21 MED ORDER — NITROFURANTOIN MONOHYD MACRO 100 MG PO CAPS: 100.0000 mg | ORAL_CAPSULE | Freq: Two times a day (BID) | ORAL | 0 refills | Status: DC

## 2017-03-21 NOTE — Progress Notes (Signed)
Subjective:     Patient ID: Tiffany Parsons, female   DOB: 07/25/1991, 26 y.o.   MRN: [id number]  HPI Patient seen with five-day history of dysuria. She's noted mostly strong odor. She's had some very mild burning with urination but not to the severity she's noted in the past. She's had one previous ear infection. Denies any frequency. No fevers or chills. No flank pain. No nausea or vomiting. No known drug allergies.  Past Medical History:  Diagnosis Date  . Bacterial infection    Past Surgical History:  Procedure Laterality Date  . WISDOM TOOTH EXTRACTION      reports that she has quit smoking. She has never used smokeless tobacco. She reports that she drinks alcohol. She reports that she does not use drugs. family history includes Hyperlipidemia in her mother. No Known Allergies    Review of Systems  Constitutional: Negative for appetite change, chills and fever.  Gastrointestinal: Negative for abdominal pain, constipation, diarrhea, nausea and vomiting.  Genitourinary: Positive for dysuria and frequency.  Musculoskeletal: Negative for back pain.  Neurological: Negative for dizziness.       Objective:   Physical Exam  Constitutional: She appears well-developed and well-nourished.  HENT:  Head: Normocephalic and atraumatic.  Neck: Neck supple. No thyromegaly present.  Cardiovascular: Normal rate, regular rhythm and normal heart sounds.   Pulmonary/Chest: Breath sounds normal.  Abdominal: Soft. Bowel sounds are normal. There is no tenderness.       Assessment:     Dysuria. Suspect uncomplicated cystitis. Urine dipstick reveals large blood and moderate leukocytes. She is not currently on her menses    Plan:     -Urine culture obtained -Start Macrobid 1 twice a day for 5 days -Drink plenty of fluids -Follow-up if symptoms persist  Kristian CoveyBruce W Burchette MD [facility]

## 2017-03-21 NOTE — Patient Instructions (Signed)

## 2017-03-23 LAB — URINE CULTURE

## 2017-09-15 ENCOUNTER — Encounter (HOSPITAL_COMMUNITY): Payer: Self-pay | Admitting: Emergency Medicine

## 2017-09-15 ENCOUNTER — Other Ambulatory Visit: Payer: Self-pay

## 2017-09-15 ENCOUNTER — Emergency Department (HOSPITAL_COMMUNITY)
Admission: EM | Admit: 2017-09-15 | Discharge: 2017-09-15 | Disposition: A | Discharge status: Home / Self Care | Payer: BC Managed Care – PPO | Attending: Emergency Medicine | Admitting: Emergency Medicine

## 2017-09-15 DIAGNOSIS — W269XXA Contact with unspecified sharp object(s), initial encounter: Secondary | ICD-10-CM | POA: Insufficient documentation

## 2017-09-15 DIAGNOSIS — Y999 Unspecified external cause status: Secondary | ICD-10-CM | POA: Insufficient documentation

## 2017-09-15 DIAGNOSIS — Z87891 Personal history of nicotine dependence: Secondary | ICD-10-CM | POA: Insufficient documentation

## 2017-09-15 DIAGNOSIS — S61512A Laceration without foreign body of left wrist, initial encounter: Secondary | ICD-10-CM | POA: Insufficient documentation

## 2017-09-15 DIAGNOSIS — Z23 Encounter for immunization: Secondary | ICD-10-CM | POA: Insufficient documentation

## 2017-09-15 DIAGNOSIS — Y939 Activity, unspecified: Secondary | ICD-10-CM | POA: Insufficient documentation

## 2017-09-15 DIAGNOSIS — Y929 Unspecified place or not applicable: Secondary | ICD-10-CM | POA: Insufficient documentation

## 2017-09-15 MED ORDER — TETANUS-DIPHTH-ACELL PERTUSSIS 5-2.5-18.5 LF-MCG/0.5 IM SUSP
0.5000 mL | Freq: Once | INTRAMUSCULAR | Status: AC
  Administered 2017-09-15: 0.5 mL via INTRAMUSCULAR
  Filled 2017-09-15: qty 0.5

## 2017-09-15 MED ORDER — LIDOCAINE-EPINEPHRINE (PF) 2 %-1:200000 IJ SOLN
10.0000 mL | Freq: Once | INTRAMUSCULAR | Status: AC
  Administered 2017-09-15: 10 mL
  Filled 2017-09-15: qty 20

## 2017-09-15 NOTE — Discharge Instructions (Signed)
Please read attached information regarding your condition. Return in 10-12 days for suture removal.  You may return to this ED, any ED, urgent care or your primary care provider's office for removal. Apply antibiotic ointment as directed. Return to ED for worsening symptoms, signs of infection including redness or drainage from site, fevers, additional injuries or lacerations.
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
## 2017-09-15 NOTE — ED Notes (Signed)
Dressing applied to sutured lac.

## 2017-09-15 NOTE — ED Notes (Signed)
PA at bedside.
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
## 2017-09-15 NOTE — ED Notes (Signed)
PA at bedside doing LAC repair

## 2018-10-29 ENCOUNTER — Inpatient Hospital Stay (HOSPITAL_COMMUNITY): Payer: BC Managed Care – PPO

## 2018-10-29 ENCOUNTER — Encounter (HOSPITAL_COMMUNITY): Payer: Self-pay | Admitting: Pharmacy Technician

## 2018-10-29 ENCOUNTER — Inpatient Hospital Stay (HOSPITAL_COMMUNITY)
Admission: AD | Admit: 2018-10-29 | Discharge: 2018-10-29 | Disposition: A | Discharge status: Home / Self Care | Payer: BC Managed Care – PPO | Attending: Family Medicine | Admitting: Family Medicine

## 2018-10-29 ENCOUNTER — Other Ambulatory Visit: Payer: Self-pay

## 2018-10-29 DIAGNOSIS — O208 Other hemorrhage in early pregnancy: Secondary | ICD-10-CM

## 2018-10-29 DIAGNOSIS — O209 Hemorrhage in early pregnancy, unspecified: Secondary | ICD-10-CM

## 2018-10-29 DIAGNOSIS — O039 Complete or unspecified spontaneous abortion without complication: Secondary | ICD-10-CM | POA: Diagnosis present

## 2018-10-29 DIAGNOSIS — Z87891 Personal history of nicotine dependence: Secondary | ICD-10-CM | POA: Insufficient documentation

## 2018-10-29 DIAGNOSIS — O26891 Other specified pregnancy related conditions, first trimester: Secondary | ICD-10-CM | POA: Diagnosis present

## 2018-10-29 HISTORY — DX: Other specified health status: Z78.9

## 2018-10-29 LAB — CBC
HCT: 38.2 % (ref 36.0–46.0)
Hemoglobin: 13.3 g/dL (ref 12.0–15.0)
MCH: 35.2 pg — ABNORMAL HIGH (ref 26.0–34.0)
MCHC: 34.8 g/dL (ref 30.0–36.0)
MCV: 101.1 fL — AB (ref 80.0–100.0)
Platelets: 216 10*3/uL (ref 150–400)
RBC: 3.78 MIL/uL — ABNORMAL LOW (ref 3.87–5.11)
RDW: 11.4 % — ABNORMAL LOW (ref 11.5–15.5)
WBC: 2.7 10*3/uL — ABNORMAL LOW (ref 4.0–10.5)
nRBC: 0 % (ref 0.0–0.2)

## 2018-10-29 LAB — URINALYSIS, ROUTINE W REFLEX MICROSCOPIC
Bacteria, UA: NONE SEEN
Bilirubin Urine: NEGATIVE
GLUCOSE, UA: NEGATIVE mg/dL
Ketones, ur: NEGATIVE mg/dL
Leukocytes,Ua: NEGATIVE
Nitrite: NEGATIVE
Protein, ur: NEGATIVE mg/dL
Specific Gravity, Urine: 1.002 — ABNORMAL LOW (ref 1.005–1.030)
pH: 7 (ref 5.0–8.0)

## 2018-10-29 LAB — POCT PREGNANCY, URINE: Preg Test, Ur: POSITIVE — AB

## 2018-10-29 LAB — HCG, QUANTITATIVE, PREGNANCY: HCG, BETA CHAIN, QUANT, S: 336 m[IU]/mL — AB (ref <5)

## 2018-10-29 LAB — ABO/RH: ABO/RH(D): O NEG

## 2018-10-29 MED ORDER — RHO D IMMUNE GLOBULIN 1500 UNIT/2ML IJ SOSY
300.0000 ug | PREFILLED_SYRINGE | Freq: Once | INTRAMUSCULAR | Status: DC
  Filled 2018-10-29: qty 2

## 2018-10-29 MED ORDER — ACETAMINOPHEN-CODEINE #3 300-30 MG PO TABS: 1.0000 | ORAL_TABLET | Freq: Four times a day (QID) | ORAL | 0 refills | Status: AC | PRN

## 2018-10-29 MED ORDER — PROMETHAZINE HCL 12.5 MG PO TABS: 12.5000 mg | ORAL_TABLET | Freq: Four times a day (QID) | ORAL | 0 refills | Status: DC | PRN

## 2018-10-29 NOTE — MAU Provider Note (Addendum)
History     CSN: [id number]  Arrival date and time: 10/29/18 1637   None     Chief Complaint  Patient presents with  . Abdominal Pain  . Emesis  . Nausea  . Vaginal Bleeding   HPI   Tiffany Parsons is 28 y.o. G1P0 female at [redacted]w[redacted]d who presents for concern for ectopic pregnancy. She started having vaginal bleeding and cramping on Friday, 3/20. [facility] because she thought she was having a miscarriage. She reports nothing was visualized in the uterus but they were unable to rule out a tubal pregnancy. She also reports that her HCG level at the [facility] was 804. She has continued to have left sided cramping and vaginal bleeding. Yesterday she had "the most severe abdominal pain (she) had ever felt" and several episodes of emesis with nausea.   OB History    Gravida  1   Para      Term      Preterm      AB      Living        SAB      TAB      Ectopic      Multiple      Live Births              Past Medical History:  Diagnosis Date  . Bacterial infection   . Medical history non-contributory     Past Surgical History:  Procedure Laterality Date  . NO PAST SURGERIES    . WISDOM TOOTH EXTRACTION      Family History  Problem Relation Age of Onset  . Hyperlipidemia Mother   . Kidney Stones Mother   . Thyroid disease Mother     Social History   Tobacco Use  . Smoking status: Former Games developer  . Smokeless tobacco: Never Used  . Tobacco comment: smoked lightly in the past  Substance Use Topics  . Alcohol use: Yes    Comment: 5 drinks; 2-3 times per week  . Drug use: No    Allergies: No Known Allergies  Medications Prior to Admission  Medication Sig Dispense Refill Last Dose  . nitrofurantoin, macrocrystal-monohydrate, (MACROBID) 100 MG capsule Take 1 capsule (100 mg total) by mouth 2 (two) times daily. 10 capsule 0     Review of Systems  Constitutional: Negative for chills and fever.  Respiratory: Negative for cough  and shortness of breath.   Cardiovascular: Negative for chest pain.  Gastrointestinal: Positive for abdominal pain, nausea and vomiting. Negative for diarrhea.  Genitourinary: Positive for vaginal bleeding. Negative for dysuria and vaginal discharge.  Musculoskeletal: Negative for back pain.  Skin: Negative for rash.  Neurological: Negative for dizziness and light-headedness.   Physical Exam   Blood pressure 130/88, pulse 79, temperature 98.6 F (37 C), temperature source Oral, resp. rate 20, height  (1.702 m), weight 65 kg, last menstrual period 09/10/2018, SpO2 99 %.  Physical Exam  Constitutional: She is oriented to person, place, and time. She appears well-developed and well-nourished. No distress.  HENT:  Head: Normocephalic and atraumatic.  Eyes: Conjunctivae and EOM are normal.  Neck: Normal range of motion. Neck supple.  Cardiovascular: Normal rate, regular rhythm and normal heart sounds.  Respiratory: Effort normal and breath sounds normal. No respiratory distress.  GI: Soft. She exhibits no distension. There is no rebound and no guarding.  Mildly TTP in the LLQ.   Genitourinary:    Genitourinary Comments: Uterus: non-tender, SE: cervix is  smooth, pink, no lesions, moderate amt of dark, red blood in vaginal vault, small quarter sized tissue-like specimen removed from cervical os removed with ring forcep; VE: FT/long/soft, no CMT or friability, no adnexal tenderness    Musculoskeletal: Normal range of motion.        General: No edema.  Neurological: She is alert and oriented to person, place, and time.  Skin: Skin is warm and dry. She is not diaphoretic.  Psychiatric: She has a normal mood and affect. Her behavior is normal.    MAU Course  Procedures  MDM CBC, ABO/Rh, and Bhcg obtained. HgB stable.  Blood type O neg. Rhogam order d/c'd d/t pt receiving Rhogam on Friday 10/25/2018 at [facility]   Ultrasound ordered to evaluate for ectopic pregnancy.    Results for orders placed or performed during the hospital encounter of 10/29/18 (from the past 24 hour(s))  Pregnancy, urine POC     Status: Abnormal   Collection Time: 10/29/18  5:37 PM  Result Value Ref Range   Preg Test, Ur POSITIVE (A) NEGATIVE  Urinalysis, Routine w reflex microscopic     Status: Abnormal   Collection Time: 10/29/18  5:42 PM  Result Value Ref Range   Color, Urine YELLOW (A) YELLOW   APPearance CLEAR (A) CLEAR   Specific Gravity, Urine 1.002 (L) 1.005 - 1.030   pH 7.0 5.0 - 8.0   Glucose, UA NEGATIVE NEGATIVE mg/dL   Hgb urine dipstick MODERATE (A) NEGATIVE   Bilirubin Urine NEGATIVE NEGATIVE   Ketones, ur NEGATIVE NEGATIVE mg/dL   Protein, ur NEGATIVE NEGATIVE mg/dL   Nitrite NEGATIVE NEGATIVE   Leukocytes,Ua NEGATIVE NEGATIVE   WBC, UA 0-5 0 - 5 WBC/hpf   Bacteria, UA NONE SEEN NONE SEEN   Squamous Epithelial / LPF 0-5 0 - 5  CBC     Status: Abnormal   Collection Time: 10/29/18  6:21 PM  Result Value Ref Range   WBC 2.7 (L) 4.0 - 10.5 K/uL   RBC 3.78 (L) 3.87 - 5.11 MIL/uL   Hemoglobin 13.3 12.0 - 15.0 g/dL   HCT 81.1 91.4 - 78.2 %   MCV 101.1 (H) 80.0 - 100.0 fL   MCH 35.2 (H) 26.0 - 34.0 pg   MCHC 34.8 30.0 - 36.0 g/dL   RDW 95.6 (L) 21.3 - 08.6 %   Platelets 216 150 - 400 K/uL   nRBC 0.0 0.0 - 0.2 %  hCG, quantitative, pregnancy     Status: Abnormal   Collection Time: 10/29/18  6:21 PM  Result Value Ref Range   hCG, Beta Chain, Quant, S 336 (H) <5 mIU/mL  ABO/Rh     Status: None   Collection Time: 10/29/18  6:22 PM  Result Value Ref Range   ABO/RH(D)      O NEG Performed at [facility], [address], [place], [place] [address]     US Ob Less Than 14 Weeks With Ob Transvaginal  Result Date: 10/29/2018 CLINICAL DATA:  Left-sided abdominal pain and vaginal bleeding in early pregnancy. Gestational age by LMP of 7 weeks 0 days. EXAM: OBSTETRIC <14 WK [place] AND TRANSVAGINAL OB US TECHNIQUE: Both transabdominal and transvaginal ultrasound  examinations were performed for complete evaluation of the gestation as well as the maternal uterus, adnexal regions, and pelvic cul-de-sac. Transvaginal technique was performed to assess early pregnancy. COMPARISON:  None. FINDINGS: Intrauterine gestational sac: None Maternal uterus/adnexae: Endometrial thickness measures 5 mm. No fibroids identified. Both ovaries are normal in appearance.  No adnexal mass or abnormal free fluid identified. IMPRESSION: Pregnancy of unknown anatomic location (no intrauterine gestational sac or adnexal mass identified). Differential diagnosis includes recent spontaneous abortion, IUP too early to visualize, and non-visualized ectopic pregnancy. Recommend correlation with serial beta-hCG levels, and follow up US if warranted clinically. Electronically Signed   By: Myles Rosenthal M.D.   On: 10/29/2018 19:48    7:41 PM Care signed out to Raelyn Mora, CNM.   De Hollingshead 10/29/2018, 5:57 PM   Assessment and Plan  Vaginal bleeding affecting early pregnancy  - Return to MAU:   If you have heavier bleeding that soaks through more that 2 pads per hour for an hour or more  If you bleed so much that you feel like you might pass out or you do pass out  If you have significant abdominal pain that is not improved with Tylenol   If you develop a fever > 100.5 - Information provided on 1st trimester VB  Miscarriage  - Advised that HCG level has dropped >50%, that there is a dx of SAB - Information provided on SAB & coping with pregnancy loss  - Discharge patient - F/U HCG with [facility] on 11/05/2018 @ 0900 // msg sent to Admin pool to get pt scheduled to see a provider in 2 wks - Patient verbalized an understanding of the plan of care and agrees.    Raelyn Mora, MSN, CNM  10/29/2018 8:01 PM

## 2018-10-29 NOTE — ED Triage Notes (Addendum)
Charted on pt in error

## 2018-10-29 NOTE — ED Provider Notes (Addendum)
The patient was inadvertently marked as being in the emergency department when she instead went to the MAU.  Please delete the emergency department encounter.  1. Erroneous encounter - disregard        Melene Plan, DO 10/29/18 1746    Melene Plan, DO 10/29/18 1746

## 2018-10-29 NOTE — MAU Note (Signed)
Presents with c/o left abdominal pain that began yesterday and VB that began last Friday.  Pt reports had +UPT.  Pt seen @ [facility] in [place]  Friday, had +UPT, ultrasound, and blood work while there.  Pt instructed to return Monday for repeat labs, missed appointment.  [facility] called pt informing her level was 804.  Pt instructed to go to be evaluated if any pain.

## 2018-10-30 LAB — RH IG WORKUP (INCLUDES ABO/RH)
ABO/RH(D): O NEG
Antibody Screen: POSITIVE
Gestational Age(Wks): 7

## 2018-11-05 ENCOUNTER — Ambulatory Visit: Payer: BC Managed Care – PPO

## 2018-11-11 ENCOUNTER — Telehealth: Payer: Self-pay | Admitting: Family Medicine

## 2018-11-11 ENCOUNTER — Ambulatory Visit: Payer: BC Managed Care – PPO | Admitting: Advanced Practice Midwife

## 2018-11-11 NOTE — Telephone Encounter (Signed)
Called the patient to inform of changes in the scheduled virtual visit. Left the patient a detailed voicemail.

## 2018-11-11 NOTE — Telephone Encounter (Signed)
Left the patient a detailed voicemail regarding the virtual visit for tomorrow.

## 2018-11-12 ENCOUNTER — Encounter: Payer: BC Managed Care – PPO | Admitting: Medical

## 2018-11-12 ENCOUNTER — Other Ambulatory Visit: Payer: Self-pay

## 2018-11-12 NOTE — Progress Notes (Signed)
Pt was called to start virtual visit. LVM stating this was an attempt to start visit and to give the office a call at earliest convenience.

## 2019-12-01 ENCOUNTER — Other Ambulatory Visit: Payer: Self-pay

## 2019-12-01 ENCOUNTER — Encounter (HOSPITAL_COMMUNITY): Payer: Self-pay | Admitting: Student

## 2019-12-01 ENCOUNTER — Emergency Department (HOSPITAL_COMMUNITY)
Admission: EM | Admit: 2019-12-01 | Discharge: 2019-12-01 | Disposition: A | Discharge status: Home / Self Care | Payer: Self-pay | Attending: Emergency Medicine | Admitting: Emergency Medicine

## 2019-12-01 ENCOUNTER — Emergency Department (HOSPITAL_COMMUNITY): Payer: Self-pay

## 2019-12-01 DIAGNOSIS — N3000 Acute cystitis without hematuria: Secondary | ICD-10-CM

## 2019-12-01 DIAGNOSIS — Z79899 Other long term (current) drug therapy: Secondary | ICD-10-CM | POA: Insufficient documentation

## 2019-12-01 DIAGNOSIS — R569 Unspecified convulsions: Secondary | ICD-10-CM | POA: Insufficient documentation

## 2019-12-01 DIAGNOSIS — F419 Anxiety disorder, unspecified: Secondary | ICD-10-CM

## 2019-12-01 LAB — URINALYSIS, ROUTINE W REFLEX MICROSCOPIC
Bilirubin Urine: NEGATIVE
Glucose, UA: NEGATIVE mg/dL
Hgb urine dipstick: NEGATIVE
Ketones, ur: 80 mg/dL — AB
Leukocytes,Ua: NEGATIVE
Nitrite: POSITIVE — AB
Protein, ur: 30 mg/dL — AB
Specific Gravity, Urine: 1.026 (ref 1.005–1.030)
pH: 5 (ref 5.0–8.0)

## 2019-12-01 LAB — ETHANOL: Alcohol, Ethyl (B): <10 mg/dL (ref <10)

## 2019-12-01 LAB — BASIC METABOLIC PANEL
Anion gap: 13 (ref 5–15)
BUN: 7 mg/dL (ref 6–20)
CO2: 24 mmol/L (ref 22–32)
Calcium: 9.9 mg/dL (ref 8.9–10.3)
Chloride: 100 mmol/L (ref 98–111)
Creatinine, Ser: 0.69 mg/dL (ref 0.44–1.00)
GFR calc Af Amer: >60 mL/min (ref >60)
GFR calc non Af Amer: >60 mL/min (ref >60)
Glucose, Bld: 124 mg/dL — ABNORMAL HIGH (ref 70–99)
Potassium: 3.4 mmol/L — ABNORMAL LOW (ref 3.5–5.1)
Sodium: 137 mmol/L (ref 135–145)

## 2019-12-01 LAB — CBC
HCT: 41.8 % (ref 36.0–46.0)
Hemoglobin: 14.5 g/dL (ref 12.0–15.0)
MCH: 35.5 pg — ABNORMAL HIGH (ref 26.0–34.0)
MCHC: 34.7 g/dL (ref 30.0–36.0)
MCV: 102.5 fL — ABNORMAL HIGH (ref 80.0–100.0)
Platelets: 246 10*3/uL (ref 150–400)
RBC: 4.08 MIL/uL (ref 3.87–5.11)
RDW: 11.2 % — ABNORMAL LOW (ref 11.5–15.5)
WBC: 5.7 10*3/uL (ref 4.0–10.5)
nRBC: 0 % (ref 0.0–0.2)

## 2019-12-01 LAB — I-STAT BETA HCG BLOOD, ED (MC, WL, AP ONLY): I-stat hCG, quantitative: <5 m[IU]/mL (ref <5)

## 2019-12-01 LAB — RAPID URINE DRUG SCREEN, HOSP PERFORMED
Amphetamines: NOT DETECTED
Barbiturates: NOT DETECTED
Benzodiazepines: NOT DETECTED
Cocaine: NOT DETECTED
Opiates: NOT DETECTED
Tetrahydrocannabinol: POSITIVE — AB

## 2019-12-01 MED ORDER — POTASSIUM CHLORIDE CRYS ER 20 MEQ PO TBCR
40.0000 meq | EXTENDED_RELEASE_TABLET | Freq: Once | ORAL | Status: AC
  Administered 2019-12-01: 40 meq via ORAL
  Filled 2019-12-01: qty 2

## 2019-12-01 MED ORDER — HYDROXYZINE HCL 25 MG PO TABS: 25.0000 mg | ORAL_TABLET | Freq: Four times a day (QID) | ORAL | 0 refills | Status: DC | PRN

## 2019-12-01 MED ORDER — NITROFURANTOIN MONOHYD MACRO 100 MG PO CAPS: 100.0000 mg | ORAL_CAPSULE | Freq: Two times a day (BID) | ORAL | 0 refills | Status: DC

## 2019-12-01 MED ORDER — HYDROXYZINE HCL 25 MG PO TABS
25.0000 mg | ORAL_TABLET | Freq: Once | ORAL | Status: AC
  Administered 2019-12-01: 25 mg via ORAL
  Filled 2019-12-01: qty 1

## 2019-12-01 MED ORDER — SODIUM CHLORIDE 0.9 % IV BOLUS
1000.0000 mL | Freq: Once | INTRAVENOUS | Status: AC
  Administered 2019-12-01: 13:00:00 1000 mL via INTRAVENOUS

## 2019-12-01 NOTE — ED Triage Notes (Signed)
Pt here for evaluation of seizures, hx of same four years ago but recurring within the last three weeks. Pt having approx 5 per week. Sts she does not go unconscious but has full body convulsions x 3-5 mins. Not on medication. Endorses headache and slight nausea.

## 2019-12-01 NOTE — ED Provider Notes (Signed)
[facility] EMERGENCY DEPARTMENT Provider Note   CSN: [id number] Arrival date & time: 12/01/19  1137     History Chief Complaint  Patient presents with  . Seizures    Tiffany Parsons is a 29 y.o. female with a hx of seizures who presents to the ED with concern for seizure activity intermittently x 3 weeks. Patient reports she is having two different types of episodes which she are referring to as seizures. She states one type involves generalized shaking of her entire body, she is typically awake and alert during these, each lasts about 3-5 minutes prior to resolution, occurring every other day, and she is typically tired afterwards. She states the second type of episodes involve sensation of shaking behind her eyes which makes her feel the urge to close them, states these last about 10 minutes at a time and occur daily. Has had intermittent headaches and fatigue with these episodes. Reports triggers include stress, lack of sleep, and caffeine.  She states she has been very anxious recently with frequent panic attacks- she states the panic attacks and seizures seem to co-inside. She has had a lot of increased stress since onset of sxs with starting a new job. Denies fever, chills, vomiting, chest pain, dyspnea, numbness, weakness, incontinence, biting of tongue, or visual disturbance. Her boyfriend at bedside provide confirms above for additional history. She also mentions mild dysuria/frequency on further questioning.   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 She denies current substance use, does admit to occasional EtOH use, but this is not daily and she no longer has issues with withdrawal. She states these episodes are different han prior seizure.   HPI     Past Medical History:  Diagnosis Date  . Bacterial infection   . Medical  history non-contributory     Patient Active Problem List   Diagnosis Date Noted  . Miscarriage 10/29/2018  . Ganglion cyst of wrist 05/19/2014    Past Surgical History:  Procedure Laterality Date  . NO PAST SURGERIES    . WISDOM TOOTH EXTRACTION       OB History    Gravida  1   Para      Term      Preterm      AB      Living        SAB      TAB      Ectopic      Multiple      Live Births              Family History  Problem Relation Age of Onset  . Hyperlipidemia Mother   . Kidney Stones Mother   . Thyroid disease Mother     Social History   Tobacco Use  . Smoking status: Former Games developer  . Smokeless tobacco: Never Used  . Tobacco comment: smoked lightly in the past  Substance Use Topics  . Alcohol use: Yes    Comment: 5 drinks; 2-3 times per week  . Drug use: No    Home Medications Prior to Admission medications   Medication Sig Start Date End Date Taking? Authorizing Provider  promethazine (PHENERGAN) 12.5 MG tablet Take 1 tablet (12.5 mg total) by mouth every 6 (six) hours as needed for nausea or vomiting. 10/29/18   Raelyn Mora, CNM  Allergies    Patient has no known allergies.  Review of Systems   Review of Systems  Constitutional: Positive for fatigue. Negative for chills and fever.  Respiratory: Negative for shortness of breath.   Cardiovascular: Negative for chest pain.  Gastrointestinal: Negative for abdominal pain and vomiting.  Genitourinary: Positive for dysuria and frequency. Negative for vaginal discharge.  Neurological: Positive for seizures and headaches. Negative for dizziness, weakness and numbness.  All other systems reviewed and are negative.   Physical Exam Updated Vital Signs BP (!) 154/104 (BP Location: Right Arm)   Pulse (!) 114   Temp 98.7 F (37.1 C) (Oral)   Resp 16   Ht 5\' 7"  (1.702 m)   Wt 61.2 kg   LMP 11/17/2018 (Exact Date)   SpO2 100%   BMI 21.14 kg/m   Physical Exam Vitals and  nursing note reviewed.  Constitutional:      General: She is not in acute distress.    Appearance: Normal appearance. She is not toxic-appearing.  HENT:     Head: Normocephalic and atraumatic.     Mouth/Throat:     Pharynx: Oropharynx is clear. Uvula midline.  Eyes:     General: Vision grossly intact. Gaze aligned appropriately.     Extraocular Movements: Extraocular movements intact.     Conjunctiva/sclera: Conjunctivae normal.     Pupils: Pupils are equal, round, and reactive to light.     Comments: No proptosis.   Cardiovascular:     Rate and Rhythm: Normal rate and regular rhythm.  Pulmonary:     Effort: Pulmonary effort is normal.     Breath sounds: Normal breath sounds.  Abdominal:     General: There is no distension.     Palpations: Abdomen is soft.     Tenderness: There is no abdominal tenderness. There is no right CVA tenderness, left CVA tenderness, guarding or rebound.  Musculoskeletal:     Cervical back: Normal range of motion and neck supple. No rigidity.  Skin:    General: Skin is warm and dry.  Neurological:     Mental Status: She is alert.     Comments: Alert. Clear speech. No facial droop. CNIII-XII grossly intact. Bilateral upper and lower extremities' sensation grossly intact. 5/5 symmetric strength with grip strength and with plantar and dorsi flexion bilaterally . Normal finger to nose bilaterally. Negative pronator drift. Gait intact.    Psychiatric:        Mood and Affect: Mood normal.        Behavior: Behavior normal.     ED Results / Procedures / Treatments   Labs (all labs ordered are listed, but only abnormal results are displayed) Labs Reviewed  BASIC METABOLIC PANEL - Abnormal; Notable for the following components:      Result Value   Potassium 3.4 (*)    Glucose, Bld 124 (*)    All other components within normal limits  CBC - Abnormal; Notable for the following components:   MCV 102.5 (*)    MCH 35.5 (*)    RDW 11.2 (*)    All other  components within normal limits  URINALYSIS, ROUTINE W REFLEX MICROSCOPIC - Abnormal; Notable for the following components:   Color, Urine AMBER (*)    APPearance HAZY (*)    Ketones, ur 80 (*)    Protein, ur 30 (*)    Nitrite POSITIVE (*)    Bacteria, UA RARE (*)    All other components within normal limits  RAPID URINE  DRUG SCREEN, HOSP PERFORMED - Abnormal; Notable for the following components:   Tetrahydrocannabinol POSITIVE (*)    All other components within normal limits  URINE CULTURE  ETHANOL  I-STAT BETA HCG BLOOD, ED ([facility], [facility], [facility] ONLY)    EKG EKG Interpretation  Date/Time:  Monday December 01 2019 11:54:59 EDT Ventricular Rate:  116 PR Interval:  132 QRS Duration: 68 QT Interval:  304 QTC Calculation: 422 R Axis:   87 Text Interpretation: Sinus tachycardia Right atrial enlargement Nonspecific T wave abnormality Abnormal ECG Confirmed by Benjiman Core [phone]) on 12/01/2019 2:49:34 PM   Radiology CT Head Wo Contrast  Result Date: 12/01/2019 CLINICAL DATA:  29 year old female with recurrent seizures in the last 3 weeks. EXAM: CT HEAD WITHOUT CONTRAST TECHNIQUE: Contiguous axial images were obtained from the base of the skull through the vertex without intravenous contrast. COMPARISON:  None. FINDINGS: Brain: Cerebral volume appears within normal limits. No midline shift, ventriculomegaly, mass effect, evidence of mass lesion, intracranial hemorrhage or evidence of cortically based acute infarction. Gray-white matter differentiation is within normal limits throughout the brain. No encephalomalacia identified. Vascular: No suspicious intracranial vascular hyperdensity. Skull: Negative. Sinuses/Orbits: Visualized paranasal sinuses and mastoids are clear. Other: Visualized orbits and scalp soft tissues are within normal limits. IMPRESSION: Normal noncontrast Head CT. Electronically Signed   By: Odessa Fleming M.D.   On: 12/01/2019 13:42    Procedures Procedures (including critical  care time)  Medications Ordered in ED Medications  sodium chloride 0.9 % bolus 1,000 mL (0 mLs Intravenous Stopped 12/01/19 1359)  hydrOXYzine (ATARAX/VISTARIL) tablet 25 mg (25 mg Oral Given 12/01/19 1313)  potassium chloride SA (KLOR-CON) CR tablet 40 mEq (40 mEq Oral Given 12/01/19 1421)    ED Course  I have reviewed the triage vital signs and the nursing notes.  Pertinent labs & imaging results that were available during my care of the patient were reviewed by me and considered in my medical decision making (see chart for details).    MDM Rules/Calculators/A&P                      This patient presents to the ED for concern for possible seizure activity intermittently x 3 weeks.  Patient is nontoxic, resting comfortably, vitals with initial tachycardia resolved on my assessment. Benign physical exam. No neuro deficits.   Additional history obtained:  Additional history obtained from chart review & patient's boyfriend at bedside.  Prior seizure history in May 2017- admit to [facility], suspected withdrawal seizure, discharged with Tegretol.  Lab Tests:  Ordered, reviewed, and interpreted labs, which included:  CBC: No significant anemia or leukocytosis.  BMP: Mild hypokalemia- oral replacement ordered. No significant electrolyte derangement. Renal function preserved.  Ethanol: WNL Preg test: Negative UA: Nitrite positive with some findings suspicious for dehydration.  UDS: Negative with exception of tetrahydrocannabinol   Imaging Studies ordered:  I ordered imaging studies which included CT head wo contrast, I independently visualized and interpreted imaging which showed no acute intracranial abnormality.   ED course: 13:00: Called to bedside by RN, patient reported having a panic attack with IV insertion. On my assessment patient appears very anxious, she is a bit tearful, mild shaking noted but would not describe as clonic movement or seizure activity, she is able to answer questions.  I asked if this felt similar to prior seizures and she states it is hard to tell sometimes if it is a seizure, panic attack, or both. Will given Atarax to help  with anxiety. Avoiding benzodiazepines given patients prior history of substance use.  Overall reassuring work-up in the ED. No significant anemia, electrolyte derangement, or renal dysfunction. Ethanol & UDS negative with exception of tetrahydrocannabinol.  CT head without mass, bleed, or other acute process. She has no nuchal rigidity or fever to raise concern for meningitis. She is without neuro deficits. Patient did not appear to have any true seizure, activity in the ED, did not appear post ictal after mild shaking/anxiety episode here. Unclear if these episodes are true seizures, not new onset seizures despite triage order set as patient has had withdrawal seizures in the past, suspicion for more anxiety as underlying etiology of her current intermittent sxs which patient agrees is typically a trigger, she also has findings suspicious for UTI therefore will tx with macrobid, no findings to raise concern for pyelo at this time. Discussed findings and plan of care with supervising physician Dr. Alvino Chapel recommends discharge home with neurology follow up.  Will trial atarax PRN. Neurology and psych follow up recommended. I discussed results, treatment plan, need for follow-up, and return precautions with the patient & her boyfriend at bedside. Discussed no driving/operating heavy machinery. Provided opportunity for questions, patient confirmed understanding and is in agreement with plan.   Portions of this note were generated with [employer]. Dictation errors may occur despite best attempts at proofreading.  Final Clinical Impression(s) / ED Diagnoses Final diagnoses:  Seizure-like activity ([place])  Anxiety  Acute cystitis without hematuria    Rx / DC Orders ED Discharge Orders         Ordered    hydrOXYzine (ATARAX/VISTARIL)  25 MG tablet  Every 6 hours PRN     12/01/19 1448    nitrofurantoin, macrocrystal-monohydrate, (MACROBID) 100 MG capsule  2 times daily     12/01/19 1455    Ambulatory referral to Neurology    Comments: An appointment is requested in approximately: 1 week   12/01/19 [address] PA-C 12/01/19 1505    Davonna Belling, MD 12/01/19 1510

## 2019-12-01 NOTE — ED Notes (Signed)
Pt aware of need for urine sample, call button at bedside

## 2019-12-01 NOTE — Discharge Instructions (Signed)
You were seen in the emergency department today for seizure-like activity.  Your work-up in the ER was overall reassuring.  Your head CT was normal.  Your labs did not show any significant abnormalities, your potassium was somewhat low at 3.4, you were given oral potassium in the emergency department.  Your urine does show findings of infection, we are treating this with Macrobid, an antibiotic, please take this as prescribed.  Please be sure to take with food to avoid stomach upset.   We are sending you home with hydroxyzine/Atarax, this is a medication to help with anxiety, please take 1 to 2 tablets every 6 hours as needed for anxiety, this can also be taken as needed for sleep.  We have prescribed you new medication(s) today. Discuss the medications prescribed today with your pharmacist as they can have adverse effects and interactions with your other medicines including over the counter and prescribed medications. Seek medical evaluation if you start to experience new or abnormal symptoms after taking one of these medicines, seek care immediately if you start to experience difficulty breathing, feeling of your throat closing, facial swelling, or rash as these could be indications of a more serious allergic reaction  We would like you to follow-up with a neurologist as well as behavioral health for reevaluation.  Please follow-up within the next 3 days.  Do not drive or operate heavy machinery until cleared by neurology.  Return to the ER for new or worsening symptoms including but not limited to worsening shaking, numbness, weakness, sudden onset headache, fever, trouble moving your neck, inability of shaking to stop, back pain, vomiting, or any other concerns.

## 2019-12-01 NOTE — ED Notes (Signed)
Patient verbalizes understanding of discharge instructions. Opportunity for questioning and answers were provided. Armband removed by staff, pt discharged from ED to home via POV. PT is alert, oriented x4, ambulatory at discharge.

## 2019-12-03 LAB — URINE CULTURE: Culture: >=100000 — AB

## 2019-12-04 ENCOUNTER — Telehealth: Payer: Self-pay

## 2019-12-04 NOTE — Telephone Encounter (Signed)
Post ED Visit - Positive Culture Follow-up  Culture report reviewed by antimicrobial stewardship pharmacist: [facility] []  , Pharm.D. []  Enzo Bi, Pharm.D., BCPS AQ-ID []  , Pharm.D., BCPS []  Celedonio Miyamoto, .D., BCPS []  [place], .D., BCPS, AAHIVP []  Georgina Pillion, Pharm.D., BCPS, AAHIVP []  [address], PharmD, BCPS []  , PharmD, BCPS []  [place], PharmD, BCPS []  [address], PharmD []  , PharmD, BCPS []  Estella Husk, PharmD [facility] []  Lysle Pearl, PharmD []  , PharmD []  Phillips Climes, PharmD []  , Rph []  Agapito Games) , PharmD []  Verlan Friends, PharmD []  , PharmD []  Mervyn Gay, PharmD []  , PharmD []  Vinnie Level, PharmD []  Edison Pace, PharmD []  , PharmD []  Len Childs, PharmD   Positive urine culture Treated with Nitrofurantoin Monohyd Macro, organism sensitive to the same and no further patient follow-up is required at this time.  12/04/2019, 12:34 PM

## 2020-04-06 ENCOUNTER — Telehealth (INDEPENDENT_AMBULATORY_CARE_PROVIDER_SITE_OTHER): Payer: Self-pay | Admitting: Family Medicine

## 2020-04-06 ENCOUNTER — Encounter: Payer: Self-pay | Admitting: Family Medicine

## 2020-04-06 DIAGNOSIS — H547 Unspecified visual loss: Secondary | ICD-10-CM

## 2020-04-06 DIAGNOSIS — G514 Facial myokymia: Secondary | ICD-10-CM

## 2020-04-06 DIAGNOSIS — R569 Unspecified convulsions: Secondary | ICD-10-CM

## 2020-04-06 NOTE — Progress Notes (Signed)
Virtual Visit via Video Note  I connected with 1990/12/09  on 04/06/20 at 11:00 AM EDT by a video enabled telemedicine application and verified that I am speaking with the correct person using two identifiers.  Location patient: home Location provider:work or home office Persons participating in the virtual visit: patient, provider  I discussed the limitations of evaluation and management by telemedicine and the availability of in person appointments. The patient expressed understanding and agreed to proceed.   HPI:  Acute visit for "Seizures": -started 4 years ago, but had not had any in several years, but then had one in 11/2019 -then had 3-4 this month and felt like she had a mild episode this morning and feels "weird" now -symptoms: eyes start twitching, mouth starts twitching, she feels like she is not there, feels like loses vision and has some weakness, once had shaking of the whole body, sometimes feels very scared and panicked after with SOB, sometimes nausea -reports 4 years ago when this happened she was on a seizure medication -episodes can last 3 minutes -denies biting of tongue, CP, palpitations, numbness, loss of bowel or bladder function, current SOB or LOC -per notes from ER evaluation in April she was supposed to follow up with neurology outpatient. Per referral notes they tried to contact her - however, she reports she never received a phone call.   ROS: See pertinent positives and negatives per HPI.  Past Medical History:  Diagnosis Date  . Bacterial infection   . Medical history non-contributory     Past Surgical History:  Procedure Laterality Date  . NO PAST SURGERIES    . WISDOM TOOTH EXTRACTION      Family History  Problem Relation Age of Onset  . Hyperlipidemia Mother   . Kidney Stones Mother   . Thyroid disease Mother     SOCIAL HX: see hpi   Current Outpatient Medications:  .  hydrOXYzine (ATARAX/VISTARIL) 25 MG tablet, Take 1-2 tablets (25-50 mg  total) by mouth every 6 (six) hours as needed for anxiety., Disp: 30 tablet, Rfl: 0 .  nitrofurantoin, macrocrystal-monohydrate, (MACROBID) 100 MG capsule, Take 1 capsule (100 mg total) by mouth 2 (two) times daily., Disp: 10 capsule, Rfl: 0 .  promethazine (PHENERGAN) 12.5 MG tablet, Take 1 tablet (12.5 mg total) by mouth every 6 (six) hours as needed for nausea or vomiting., Disp: 30 tablet, Rfl: 0  EXAM:  VITALS per patient if applicable:  GENERAL: alert, oriented, appears well and in no acute distress  HEENT: atraumatic, conjunttiva clear, PER, no obvious abnormalities on inspection of external nose and ears  NECK: normal movements of the head and neck  LUNGS: on inspection no signs of respiratory distress, breathing rate appears normal, no obvious gross SOB, gasping or wheezing  CV: no obvious cyanosis  MS: moves all visible extremities without noticeable abnormality  PSYCH/NEURO: pleasant and cooperative, no obvious depression or anxiety, speech and thought processing grossly intact  ASSESSMENT AND PLAN:  Discussed the following assessment and plan:  Facial twitching  Convulsive disorder (HCC)  Vision loss  -we discussed possible serious and likely etiologies, options for evaluation and workup, limitations of telemedicine visit vs in person visit, treatment, treatment risks and precautions. Pt prefers to treat via telemedicine empirically rather then risking or undertaking an in person visit at this moment. However, advised given her concerning symptoms needs inperson evaluation promptly for neuro exam and further workup especially given more frequent symptoms and symptoms today. She agreed and agrees to go  to nearby [facility] today. On review of chart it appears she was referred to [facility] back in April. Patient reports she never received a call from them. I did provide her with their contact information as well so that she could also get started on rebooking that appt.   Work/School slipped offered: n/a - referred for further evaluation today Did let her know that I only do telemedicine on Tuesdays and Thursdays for [place] and advised follow up visit with PCP or UCC if needs follow up or if any further questions arise to avoid any delays. She does not have PCP - advised her of options and advised to also schedule PCP NPV visit.   I discussed the assessment and treatment plan with the patient. The patient was provided an opportunity to ask questions and all were answered. The patient agreed with the plan and demonstrated an understanding of the instructions.   The patient was advised to call back or seek an in-person evaluation if the symptoms worsen or if the condition fails to improve as anticipated.   Terressa Koyanagi, DO

## 2020-04-06 NOTE — Patient Instructions (Signed)
-  Go to Urgent Care for evaluation today.  -Also contact the Neurology office per the information provided at your appointment to schedule evaluation.

## 2020-09-17 ENCOUNTER — Emergency Department (HOSPITAL_COMMUNITY)
Admission: EM | Admit: 2020-09-17 | Discharge: 2020-09-17 | Disposition: A | Discharge status: Home / Self Care | Payer: Self-pay | Attending: Emergency Medicine | Admitting: Emergency Medicine

## 2020-09-17 ENCOUNTER — Encounter (HOSPITAL_COMMUNITY): Payer: Self-pay | Admitting: Emergency Medicine

## 2020-09-17 DIAGNOSIS — Y904 Blood alcohol level of 80-99 mg/100 ml: Secondary | ICD-10-CM | POA: Insufficient documentation

## 2020-09-17 DIAGNOSIS — F10920 Alcohol use, unspecified with intoxication, uncomplicated: Secondary | ICD-10-CM

## 2020-09-17 DIAGNOSIS — Z87891 Personal history of nicotine dependence: Secondary | ICD-10-CM | POA: Insufficient documentation

## 2020-09-17 DIAGNOSIS — U071 COVID-19: Secondary | ICD-10-CM | POA: Insufficient documentation

## 2020-09-17 DIAGNOSIS — R112 Nausea with vomiting, unspecified: Secondary | ICD-10-CM | POA: Insufficient documentation

## 2020-09-17 DIAGNOSIS — F10129 Alcohol abuse with intoxication, unspecified: Secondary | ICD-10-CM | POA: Insufficient documentation

## 2020-09-17 LAB — COMPREHENSIVE METABOLIC PANEL
ALT: 25 U/L (ref 0–44)
AST: 61 U/L — ABNORMAL HIGH (ref 15–41)
Albumin: 4.4 g/dL (ref 3.5–5.0)
Alkaline Phosphatase: 48 U/L (ref 38–126)
Anion gap: 16 — ABNORMAL HIGH (ref 5–15)
BUN: <5 mg/dL — ABNORMAL LOW (ref 6–20)
CO2: 21 mmol/L — ABNORMAL LOW (ref 22–32)
Calcium: 9.2 mg/dL (ref 8.9–10.3)
Chloride: 100 mmol/L (ref 98–111)
Creatinine, Ser: 0.43 mg/dL — ABNORMAL LOW (ref 0.44–1.00)
GFR, Estimated: >60 mL/min (ref >60)
Glucose, Bld: 109 mg/dL — ABNORMAL HIGH (ref 70–99)
Potassium: 3.8 mmol/L (ref 3.5–5.1)
Sodium: 137 mmol/L (ref 135–145)
Total Bilirubin: 1.2 mg/dL (ref 0.3–1.2)
Total Protein: 6.9 g/dL (ref 6.5–8.1)

## 2020-09-17 LAB — CBC
HCT: 38 % (ref 36.0–46.0)
Hemoglobin: 13.6 g/dL (ref 12.0–15.0)
MCH: 35.8 pg — ABNORMAL HIGH (ref 26.0–34.0)
MCHC: 35.8 g/dL (ref 30.0–36.0)
MCV: 100 fL (ref 80.0–100.0)
Platelets: 170 10*3/uL (ref 150–400)
RBC: 3.8 MIL/uL — ABNORMAL LOW (ref 3.87–5.11)
RDW: 10.9 % — ABNORMAL LOW (ref 11.5–15.5)
WBC: 4.7 10*3/uL (ref 4.0–10.5)
nRBC: 0 % (ref 0.0–0.2)

## 2020-09-17 LAB — I-STAT BETA HCG BLOOD, ED (MC, WL, AP ONLY): I-stat hCG, quantitative: <5 m[IU]/mL (ref <5)

## 2020-09-17 LAB — RESP PANEL BY RT-PCR (FLU A&B, COVID) ARPGX2
Influenza A by PCR: NEGATIVE
Influenza B by PCR: NEGATIVE
SARS Coronavirus 2 by RT PCR: POSITIVE — AB

## 2020-09-17 LAB — ETHANOL: Alcohol, Ethyl (B): 97 mg/dL — ABNORMAL HIGH (ref <10)

## 2020-09-17 MED ORDER — LACTATED RINGERS IV BOLUS
1000.0000 mL | Freq: Once | INTRAVENOUS | Status: AC
  Administered 2020-09-17: 1000 mL via INTRAVENOUS

## 2020-09-17 MED ORDER — PROMETHAZINE HCL 25 MG/ML IJ SOLN: 25.0000 mg | Freq: Once | INTRAMUSCULAR | Status: DC

## 2020-09-17 MED ORDER — LORAZEPAM 2 MG/ML IJ SOLN
2.0000 mg | Freq: Once | INTRAMUSCULAR | Status: AC
  Administered 2020-09-17: 2 mg via INTRAVENOUS
  Filled 2020-09-17: qty 1

## 2020-09-17 MED ORDER — LORAZEPAM 1 MG PO TABS: ORAL_TABLET | ORAL | 0 refills | Status: DC

## 2020-09-17 MED ORDER — ONDANSETRON HCL 4 MG/2ML IJ SOLN
4.0000 mg | Freq: Once | INTRAMUSCULAR | Status: AC
  Administered 2020-09-17: 4 mg via INTRAVENOUS
  Filled 2020-09-17: qty 2

## 2020-09-17 MED ORDER — ONDANSETRON 4 MG PO TBDP: 4.0000 mg | ORAL_TABLET | Freq: Three times a day (TID) | ORAL | 0 refills | Status: AC | PRN

## 2020-09-17 NOTE — ED Provider Notes (Signed)
[facility] EMERGENCY DEPARTMENT Provider Note   CSN: [id number] Arrival date & time: 09/17/20  1734     History Chief Complaint  Patient presents with  . Drug / Alcohol Assessment    Tiffany Parsons is a 30 y.o. female with a history of alcohol abuse who presents to the ED via EMS for EtOH withdrawal. Nausea and vomiting for the last 2 weeks since she walked in on her husband having sex with another man. She reports binge drinking for nearly 1 week afterwards and then stopped drinking approximately 2-3 days ago. She tried taking previously prescribed chlordiazepoxide but has unable to keep it down. She is worried that she may have a seizure from her withdrawal. She reports that she may have had a seizure several days ago, but states she was awake during that time and remembered her arms clinching together for a brief time and then spontaneously resolved. Denies hallucinations, diaphoresis, tremors, fever, chills, or abdominal pain. No coingestions. Denies SI or HI.  The history is provided by the patient and medical records.  Emesis Severity:  Moderate Duration:  2 weeks Timing:  Intermittent Number of daily episodes:  Too many to count Quality:  Bilious material and stomach contents Progression:  Unchanged Chronicity:  New Recent urination:  Decreased Relieved by:  Nothing Worsened by:  Nothing Ineffective treatments: Pepto Bismol. Associated symptoms: chills and diarrhea   Associated symptoms: no abdominal pain, no arthralgias, no cough, no fever, no headaches, no sore throat and no URI   Risk factors: no sick contacts, no suspect food intake and no travel to endemic areas        Past Medical History:  Diagnosis Date  . Bacterial infection   . Medical history non-contributory     Patient Active Problem List   Diagnosis Date Noted  . Miscarriage 10/29/2018  . Ganglion cyst of wrist 05/19/2014    Past Surgical History:  Procedure Laterality Date  . NO  PAST SURGERIES    . WISDOM TOOTH EXTRACTION       OB History    Gravida  1   Para      Term      Preterm      AB      Living        SAB      IAB      Ectopic      Multiple      Live Births              Family History  Problem Relation Age of Onset  . Hyperlipidemia Mother   . Kidney Stones Mother   . Thyroid disease Mother     Social History   Tobacco Use  . Smoking status: Former Games developer  . Smokeless tobacco: Never Used  . Tobacco comment: smoked lightly in the past  Vaping Use  . Vaping Use: Never used  Substance Use Topics  . Alcohol use: Yes    Comment: 5 drinks; 2-3 times per week  . Drug use: No    Home Medications Prior to Admission medications   Medication Sig Start Date End Date Taking? Authorizing Provider  LORazepam (ATIVAN) 1 MG tablet Take 1 tablet (1 mg total) by mouth QID for 3 days, THEN 1 tablet (1 mg total) 3 (three) times daily for 3 days, THEN 1 tablet (1 mg total) 2 (two) times daily for 3 days, THEN 0.5 tablets (0.5 mg total) 2 (two) times daily for 3 days, THEN  0.5 tablets (0.5 mg total) daily for 3 days. 09/17/20 10/02/20 Yes Tonia Brooms, MD  ondansetron (ZOFRAN ODT) 4 MG disintegrating tablet Take 1 tablet (4 mg total) by mouth every 8 (eight) hours as needed for up to 3 days for nausea or vomiting. 09/17/20 09/20/20 Yes Tonia Brooms, MD  hydrOXYzine (ATARAX/VISTARIL) 25 MG tablet Take 1-2 tablets (25-50 mg total) by mouth every 6 (six) hours as needed for anxiety. 12/01/19   Petrucelli, Pleas Koch, PA-C  nitrofurantoin, macrocrystal-monohydrate, (MACROBID) 100 MG capsule Take 1 capsule (100 mg total) by mouth 2 (two) times daily. 12/01/19   Petrucelli, Pleas Koch, PA-C  promethazine (PHENERGAN) 12.5 MG tablet Take 1 tablet (12.5 mg total) by mouth every 6 (six) hours as needed for nausea or vomiting. 10/29/18   Raelyn Mora, CNM    Allergies    Patient has no known allergies.  Review of Systems   Review of Systems   Constitutional: Positive for appetite change and chills. Negative for fever.  HENT: Negative for ear pain and sore throat.   Eyes: Negative for pain and visual disturbance.  Respiratory: Negative for cough and shortness of breath.   Cardiovascular: Negative for chest pain and palpitations.  Gastrointestinal: Positive for diarrhea, nausea and vomiting. Negative for abdominal pain.  Genitourinary: Negative for dysuria and hematuria.  Musculoskeletal: Negative for arthralgias and back pain.  Skin: Negative for color change and rash.  Neurological: Negative for tremors, seizures, syncope and headaches.  Psychiatric/Behavioral: Negative for hallucinations and suicidal ideas.  All other systems reviewed and are negative.   Physical Exam Updated Vital Signs BP (!) 135/98   Pulse (!) 103   Temp 98.7 F (37.1 C) (Oral)   Resp 16   SpO2 98%   Physical Exam Vitals and nursing note reviewed.  Constitutional:      General: She is not in acute distress.    Appearance: Normal appearance. She is well-developed and well-nourished.  HENT:     Head: Normocephalic and atraumatic.     Right Ear: External ear normal.     Left Ear: External ear normal.     Nose: Nose normal.  Eyes:     General: No scleral icterus.       Right eye: No discharge.        Left eye: No discharge.     Extraocular Movements: Extraocular movements intact.     Conjunctiva/sclera: Conjunctivae normal.     Pupils: Pupils are equal, round, and reactive to light.  Cardiovascular:     Rate and Rhythm: Normal rate and regular rhythm.     Pulses: Normal pulses.     Heart sounds: Normal heart sounds. No murmur heard.   Pulmonary:     Effort: Pulmonary effort is normal. No respiratory distress.     Breath sounds: Normal breath sounds. No wheezing, rhonchi or rales.  Abdominal:     General: Abdomen is flat. Bowel sounds are normal. There is no distension.     Palpations: Abdomen is soft.     Tenderness: There is no  abdominal tenderness. There is no guarding or rebound.  Musculoskeletal:        General: No edema.     Cervical back: Neck supple.     Right lower leg: No edema.     Left lower leg: No edema.  Skin:    General: Skin is warm and dry.     Coloration: Skin is pale.     Findings: No rash.  Neurological:  General: No focal deficit present.     Mental Status: She is alert and oriented to person, place, and time.     Motor: No weakness.     Comments: No tremor noted to extremities.  Psychiatric:        Attention and Perception: Attention normal.        Mood and Affect: Mood and affect normal. Affect is tearful.        Speech: Speech normal.        Behavior: Behavior normal. Behavior is cooperative.        Thought Content: Thought content normal. Thought content does not include homicidal or suicidal ideation. Thought content does not include homicidal or suicidal plan.     ED Results / Procedures / Treatments   Labs (all labs ordered are listed, but only abnormal results are displayed) Labs Reviewed  RESP PANEL BY RT-PCR (FLU A&B, COVID) ARPGX2 - Abnormal; Notable for the following components:      Result Value   SARS Coronavirus 2 by RT PCR POSITIVE (*)    All other components within normal limits  COMPREHENSIVE METABOLIC PANEL - Abnormal; Notable for the following components:   CO2 21 (*)    Glucose, Bld 109 (*)    BUN <5 (*)    Creatinine, Ser 0.43 (*)    AST 61 (*)    Anion gap 16 (*)    All other components within normal limits  ETHANOL - Abnormal; Notable for the following components:   Alcohol, Ethyl (B) 97 (*)    All other components within normal limits  CBC - Abnormal; Notable for the following components:   RBC 3.80 (*)    MCH 35.8 (*)    RDW 10.9 (*)    All other components within normal limits  I-STAT BETA HCG BLOOD, ED (MC, [facility], AP ONLY)    EKG EKG Interpretation  Date/Time:  Friday September 17 2020 17:44:21 EST Ventricular Rate:  92 PR  Interval:  120 QRS Duration: 68 QT Interval:  346 QTC Calculation: 427 R Axis:   63 Text Interpretation: Normal sinus rhythm Normal ECG since last tracing no significant change Confirmed by Eber Hong ([id number]) on 09/17/2020 7:23:23 PM   Radiology No results found.  Procedures Procedures   Medications Ordered in ED Medications  promethazine (PHENERGAN) injection 25 mg (25 mg Intramuscular Not Given 09/17/20 2013)  ondansetron [facility]) injection 4 mg (4 mg Intravenous Given 09/17/20 1917)  lactated ringers bolus 1,000 mL (0 mLs Intravenous Stopped 09/17/20 2012)  LORazepam (ATIVAN) injection 2 mg (2 mg Intravenous Given 09/17/20 2013)  ondansetron (ZOFRAN) injection 4 mg (4 mg Intravenous Given 09/17/20 2341)    ED Course  I have reviewed the triage vital signs and the nursing notes.  Pertinent labs & imaging results that were available during my care of the patient were reviewed by me and considered in my medical decision making (see chart for details).    MDM Rules/Calculators/A&P                          Patient is a 29yoF with history and physical as described above who presents to the ED for N/V and alcohol withdrawal. VS reassuring and HDS. No AVH, hyperactivity, diaphoresis, AMS, or seizures. Patient mentating appropriately and GCS 15. Initial workup started in triage and included preg, CMP, CBC, ethanol, and ECG. Additional workup includes COVID. Initial treatment includes IVF bolus, Zofran, Phenergan, and Ativan. On initial assessment,  patient does not appear to be in acute alcohol withdrawal and no signs of autonomic hyperactivity or instability.  On reassessment, patient states she is feeling much better and tolerating PO. Diagnostic workup includes COVID positive and ethanol 97. ECG reassuring. AG mildly elevated at 16; suspect likely due to dehydration and alcohol ketoacidosis. Doubt sepsis at this time given afebrile, no tachycardia, no tachypnea, and no leukocytosis.  Suspect presentation likely secondary to COVID infection as well as significant stressors at home. HPI and physical exam not consistent with DT. Diagnostic workup reassuring for electrolyte derangement, AKI, preeclampsia, ectopic pregnancy, or hepatobiliary disease. Denies SI, HI, AVH, and feels safe at home. Mother states she can stay with patient and frequently check on her. She otherwise remained HDS with no acute events during ED course.  Plan will be to discharge patient home with Ativan taper and close PCP follow up . Will also prescribe Zofran. Strict return precautions provided and discussed. Questions and concerns addressed. Substance abuse and counseling resources included in discharge paperwork. Patient and mother verbalized understanding and amenable with discharge plan. Patient discharged in stable condition.  Final Clinical Impression(s) / ED Diagnoses Final diagnoses:  Alcoholic intoxication without complication (HCC)  COVID-19  Nausea vomiting and diarrhea    Rx / DC Orders ED Discharge Orders         Ordered    LORazepam (ATIVAN) 1 MG tablet        09/17/20 2222    ondansetron (ZOFRAN ODT) 4 MG disintegrating tablet  Every 8 hours PRN        09/17/20 2222           Tonia Brooms, MD 09/18/20 Jacinta Shoe    Eber Hong, MD 09/20/20 2314

## 2020-09-17 NOTE — ED Notes (Signed)
CIWA=12

## 2020-09-17 NOTE — ED Triage Notes (Signed)
Patient BIB EMS for evaluation of ETOH withdrawal. Patient prescribed librium for withdrawal symptoms but has been unable to keep it down due to nausea and vomiting over the last few days. Patient lethargic, oriented. States she had a seizure yesterday due to withdrawal, felt like she was going to have a seizure earlier today so she drank half of one beer. Patient states she is unsure of how much she was drinking daily.

## 2020-09-17 NOTE — Discharge Instructions (Signed)
Substance Abuse Treatment Programs ° °Intensive Outpatient Programs °[facility]     °[address]      °[place], [place]                   °[phone]      ° °[facility] °[address] °[place], [place] °[phone] ° °[facility]     °(Inpatient and outpatient)     °[address]           °[phone]   ° °[facility] °[phone] (Suboxone and Methadone) ° °[address]      °[place], [place] [address]      °[phone]      ° °[address] °[place], [place] °[phone] ° °[facility] (Outpatient/Inpatient, Chemical)    °(insurance only) [phone]      °       °[facility] (Groups & Residential) °[place], [place] °[phone] ° °   °[facility]     °[address]     °[place], [place]      °[phone]      ° °[facility] (for caregivers and family) °[address] °[place], [place] °336-299-4655 ° ° ° ° ° °Residential Treatment Programs °[facility]      °[address], [place], [place] [address]  °[phone]      ° °[facility] °[address] [place], [place] [address] °[phone] ° °[facility]        °[phone]      ° °[facility] °[phone] ° °[facility]             °[address]                                         °[place], [place]                                                °[phone] or [phone]                              ° °[facility] [place] °[address] °[place], [address] °[phone] ° °[facility]    °[address]      °[place], [place]     °[phone]      ° °[facility] °[address] °[place], [place] °[phone] ° °[facility]   °[address]     °[place], [place] [address]     °[phone]      °Admissions: 8am-3pm M-F ° °[facility] °[address] °[place] °[phone] ° °[facility]: Residential Program (90 Days)   °[place], [place]      °336-725-8389 or [phone]    ° °[facility] °[place], [place] °(Walk in Hours over the weekend or by referral) ° °[facility] °[address], [place], [place] [address] °([phone] ° °Crisis Mobile: [facility]:  1-877-626-1772 (for crisis response 24 hours a day) °[facility] Hotline:      1-800-256-2452 °Outpatient Psychiatry and Counseling ° °[facility]: Mobile Crisis   Management 24 hours:  1-877-626-1772 ° °[facility] sliding scale fee and walk in schedule: M-F 8am-12pm/1pm-3pm °[address]  °[place], [place] [address] °[phone] ° °[facility] °[address] °[place], [place] [address] °[phone] ° °[facility] ([facility] known [facility])- new patient walk-in appointments available Monday - Friday 8am -3pm.          °[address] °[place], [place] [address] °336-676-6840 or crisis line- 336-676-6905 ° °[facility] Intensive Outpatient Therapy Program °[address] °[place], [place] [address] °336-832-9804 ° °[facility]                  °Crisis Services      °[phone]      °[address]     °[place], [place] [address]                ° °[facility]   °[facility] °[phone] °[address] °[place], [place] [address] ° ° °[facility]          °[address]  °[place], [place] [address]       °[phone] ° °[facility] °[address] °[place], [place] [address] °336-292-1510 ° °[facility]    °[address]    °[place], [place] [address]     °[phone]      ° °Parish McKinney, MD     °[address]     °[place] [address]     °[phone]     °  °[facility] °[address] °[place] [address] ° °[facility]     °[address]     °[place], [place]      °336-542-2076      ° °[facility] °Shamsher Ahluwalia, MD °[address] °[place], [place] [address] °[phone] ° °[facility]     °[address]     °[place], [place] [address]     °336-274-1237      ° °[facility] °[address] °[place], [place] [address] °336-854-4450 °Resources for Temporary Residential Assistance/Crisis Centers ° °DAY CENTERS °[facility] °M-F 8am-3pm   °[address] [place], [place] [address]   336-332-0824 °Services include: laundry, barbering, support groups, case management, phone  & computer access, showers, AA/NA mtgs, mental health/substance abuse nurse, job skills class, disability information, VA assistance, spiritual classes, etc.  ° °HOMELESS SHELTERS ° °[facility]     °[facility]   °[address], [place]     °[phone]       °       °[facility] (women and children)       °[address] °[place], [place] [address] °336-275-0820 °[email or web address] for application and process °Application Required ° °[facility]   °[address]    °[place] [address]     °[phone]       °             °[facility] °[address] °, [place] [address] °[phone] °336-235-0363(schedule application appt.) °Application Required ° °[facility] (women only)    °[address]     °[place],  [address]     °336-884-1039      °  Intake starts 6pm daily °Need valid ID, SSC, & Police report °[facility] [place] °[address] °[place], [place] °336-881-5420 °Application Required ° °[facility] (men only)     °[address]      °[place], [place]     °[phone]      ° °[facility] °(Pregnant women only) °[address] °[place], [place] °336-275-0206 ° °[facility]      °[address]      °[place], [place] [address]     °[phone]      °       °[facility] °[address] °[place], [place] °336-723-1848 °90 day commitment/SA/Application process ° °[facility](men only)     °[address]     °[place], [place]     °[phone]       °Check-in at 7pm     °       °[facility] °[address] °[place],  [address] °336-248-6684 °Men/Women/Women and Children must be there by 7 pm ° °[facility] °[place],  °336-722-8721                ° °

## 2020-09-20 ENCOUNTER — Telehealth: Payer: Self-pay | Admitting: Surgery

## 2020-09-20 ENCOUNTER — Telehealth (HOSPITAL_COMMUNITY): Payer: Self-pay | Admitting: Emergency Medicine

## 2020-09-20 ENCOUNTER — Telehealth: Payer: Self-pay | Admitting: *Deleted

## 2020-09-20 MED ORDER — LORAZEPAM 1 MG PO TABS: ORAL_TABLET | ORAL | 0 refills | Status: DC

## 2020-09-20 NOTE — Telephone Encounter (Signed)
I was asked by nurse case manager Durward Mallard in regards to previous Ativan taper prescription for this patient. I reviewed the patient's chart. Patient was prescribed an Ativan taper but was only prescribed 15 tablets when she requires 31.5 to complete the taper. Pharmacy would not fill unless this is correct. I have reviewed the [place] substance reporting database. I provided another prescription which will be sufficient to fill Ativan taper.

## 2020-09-20 NOTE — Telephone Encounter (Signed)
Resent prescription after initial prescription with incorrect total tablets prescribed.

## 2020-09-20 NOTE — Telephone Encounter (Signed)
ED CM received call from patient and  [facility] pharmacy about not being able to process the Ativan prescription change.  Advise to resend to another [facility] [address] Message sent to Dr. Mellody Dance.to follow up

## 2020-09-20 NOTE — Telephone Encounter (Signed)
Resent Ativan taper prescription after original prescription quantity incorrect and pharmacy unable to correct.

## 2020-09-20 NOTE — Telephone Encounter (Signed)
Pt called regarding pharmacy not filling Rx as written.  RNCM consulted EDP Geiple who resent Rx with corrected #.  No further CM needs identified at this time.

## 2020-09-27 ENCOUNTER — Other Ambulatory Visit: Payer: Self-pay

## 2020-09-27 ENCOUNTER — Encounter (HOSPITAL_COMMUNITY): Payer: Self-pay

## 2020-09-27 ENCOUNTER — Emergency Department (HOSPITAL_COMMUNITY)
Admission: EM | Admit: 2020-09-27 | Discharge: 2020-09-28 | Disposition: A | Discharge status: Other Acute Inpatient Hospital | Payer: Self-pay | Attending: Emergency Medicine | Admitting: Emergency Medicine

## 2020-09-27 DIAGNOSIS — Z20822 Contact with and (suspected) exposure to covid-19: Secondary | ICD-10-CM | POA: Insufficient documentation

## 2020-09-27 DIAGNOSIS — F4323 Adjustment disorder with mixed anxiety and depressed mood: Secondary | ICD-10-CM | POA: Diagnosis present

## 2020-09-27 DIAGNOSIS — R569 Unspecified convulsions: Secondary | ICD-10-CM | POA: Insufficient documentation

## 2020-09-27 DIAGNOSIS — F1020 Alcohol dependence, uncomplicated: Secondary | ICD-10-CM | POA: Diagnosis present

## 2020-09-27 DIAGNOSIS — F10139 Alcohol abuse with withdrawal, unspecified: Secondary | ICD-10-CM | POA: Insufficient documentation

## 2020-09-27 DIAGNOSIS — F1010 Alcohol abuse, uncomplicated: Secondary | ICD-10-CM

## 2020-09-27 DIAGNOSIS — Y908 Blood alcohol level of 240 mg/100 ml or more: Secondary | ICD-10-CM | POA: Insufficient documentation

## 2020-09-27 DIAGNOSIS — Z87891 Personal history of nicotine dependence: Secondary | ICD-10-CM | POA: Insufficient documentation

## 2020-09-27 DIAGNOSIS — F419 Anxiety disorder, unspecified: Secondary | ICD-10-CM | POA: Insufficient documentation

## 2020-09-27 DIAGNOSIS — Z046 Encounter for general psychiatric examination, requested by authority: Secondary | ICD-10-CM | POA: Insufficient documentation

## 2020-09-27 LAB — CBC WITH DIFFERENTIAL/PLATELET
Abs Immature Granulocytes: 0 K/uL (ref 0.00–0.07)
Basophils Absolute: 0 K/uL (ref 0.0–0.1)
Basophils Relative: 1 %
Eosinophils Absolute: 0 K/uL (ref 0.0–0.5)
Eosinophils Relative: 0 %
HCT: 41.7 % (ref 36.0–46.0)
Hemoglobin: 14 g/dL (ref 12.0–15.0)
Immature Granulocytes: 0 %
Lymphocytes Relative: 54 %
Lymphs Abs: 2 K/uL (ref 0.7–4.0)
MCH: 35 pg — ABNORMAL HIGH (ref 26.0–34.0)
MCHC: 33.6 g/dL (ref 30.0–36.0)
MCV: 104.3 fL — ABNORMAL HIGH (ref 80.0–100.0)
Monocytes Absolute: 0.3 K/uL (ref 0.1–1.0)
Monocytes Relative: 9 %
Neutro Abs: 1.3 K/uL — ABNORMAL LOW (ref 1.7–7.7)
Neutrophils Relative %: 36 %
Platelets: 231 K/uL (ref 150–400)
RBC: 4 MIL/uL (ref 3.87–5.11)
RDW: 12.1 % (ref 11.5–15.5)
WBC: 3.6 K/uL — ABNORMAL LOW (ref 4.0–10.5)
nRBC: 0 % (ref 0.0–0.2)

## 2020-09-27 LAB — URINALYSIS, ROUTINE W REFLEX MICROSCOPIC
Bacteria, UA: NONE SEEN
Bilirubin Urine: NEGATIVE
Glucose, UA: NEGATIVE mg/dL
Ketones, ur: NEGATIVE mg/dL
Leukocytes,Ua: NEGATIVE
Nitrite: NEGATIVE
Protein, ur: NEGATIVE mg/dL
Specific Gravity, Urine: 1.017 (ref 1.005–1.030)
pH: 5 (ref 5.0–8.0)

## 2020-09-27 LAB — COMPREHENSIVE METABOLIC PANEL WITH GFR
ALT: 25 U/L (ref 0–44)
AST: 65 U/L — ABNORMAL HIGH (ref 15–41)
Albumin: 4.8 g/dL (ref 3.5–5.0)
Alkaline Phosphatase: 52 U/L (ref 38–126)
Anion gap: 13 (ref 5–15)
BUN: 6 mg/dL (ref 6–20)
CO2: 26 mmol/L (ref 22–32)
Calcium: 8.7 mg/dL — ABNORMAL LOW (ref 8.9–10.3)
Chloride: 102 mmol/L (ref 98–111)
Creatinine, Ser: 0.52 mg/dL (ref 0.44–1.00)
GFR, Estimated: >60 mL/min
Glucose, Bld: 94 mg/dL (ref 70–99)
Potassium: 4.1 mmol/L (ref 3.5–5.1)
Sodium: 141 mmol/L (ref 135–145)
Total Bilirubin: 0.6 mg/dL (ref 0.3–1.2)
Total Protein: 8 g/dL (ref 6.5–8.1)

## 2020-09-27 LAB — RAPID URINE DRUG SCREEN, HOSP PERFORMED
Amphetamines: NOT DETECTED
Barbiturates: NOT DETECTED
Benzodiazepines: POSITIVE — AB
Cocaine: NOT DETECTED
Opiates: NOT DETECTED
Tetrahydrocannabinol: NOT DETECTED

## 2020-09-27 LAB — ACETAMINOPHEN LEVEL: Acetaminophen (Tylenol), Serum: <10 ug/mL — ABNORMAL LOW (ref 10–30)

## 2020-09-27 LAB — SALICYLATE LEVEL: Salicylate Lvl: <7 mg/dL — ABNORMAL LOW (ref 7.0–30.0)

## 2020-09-27 LAB — I-STAT BETA HCG BLOOD, ED (MC, WL, AP ONLY): I-stat hCG, quantitative: <5 m[IU]/mL

## 2020-09-27 LAB — ETHANOL: Alcohol, Ethyl (B): 306 mg/dL (ref <10)

## 2020-09-27 MED ORDER — LORAZEPAM 1 MG PO TABS
0.0000 mg | ORAL_TABLET | Freq: Four times a day (QID) | ORAL | Status: DC
  Administered 2020-09-27 – 2020-09-28 (×2): 1 mg via ORAL
  Filled 2020-09-27 (×2): qty 1

## 2020-09-27 MED ORDER — THIAMINE HCL 100 MG/ML IJ SOLN: 100.0000 mg | Freq: Every day | INTRAMUSCULAR | Status: DC

## 2020-09-27 MED ORDER — LORAZEPAM 2 MG/ML IJ SOLN: 0.0000 mg | Freq: Two times a day (BID) | INTRAMUSCULAR | Status: DC

## 2020-09-27 MED ORDER — LORAZEPAM 1 MG PO TABS: 0.0000 mg | ORAL_TABLET | Freq: Two times a day (BID) | ORAL | Status: DC

## 2020-09-27 MED ORDER — LORAZEPAM 2 MG/ML IJ SOLN: 0.0000 mg | Freq: Four times a day (QID) | INTRAMUSCULAR | Status: DC

## 2020-09-27 MED ORDER — THIAMINE HCL 100 MG PO TABS
100.0000 mg | ORAL_TABLET | Freq: Every day | ORAL | Status: DC
  Administered 2020-09-28: 100 mg via ORAL
  Filled 2020-09-27: qty 1

## 2020-09-27 NOTE — ED Provider Notes (Addendum)
[facility]-EMERGENCY DEPT Provider Note   CSN: [id number] Arrival date & time: 09/27/20  1850     History Chief Complaint  Patient presents with  . Psychiatric Evaluation  . IVC    Tiffany Parsons is a 30 y.o. female.  HPI Patient brought in under IVC.  Per IVC paperwork has been drinking heavily and mixing with prescription medications.  Patient states she just takes her Ativan for anxiety.  Denies drinking heavily but states she last drank yesterday.  Per IVC paperwork she has been seeing things and hallucinating.  Patient states this is not true.  Per IVC paperwork patient has been saying that she wants to die.  Patient states that she never said this but states she just wants to be by herself.  At the beginning of this month she found her husband in bed with another man.  Denies history of depression.  States she does have history of seizures that come on with stress and reviewing records may have had seizures a come on with alcohol use/withdrawal.  States that she had her Ativan around the house so her ex would not find it.  When I asked the patient when her last seizure was she says she did not know what there may be one coming on soon.    Past Medical History:  Diagnosis Date  . Bacterial infection   . Medical history non-contributory     Patient Active Problem List   Diagnosis Date Noted  . Miscarriage 10/29/2018  . Ganglion cyst of wrist 05/19/2014    Past Surgical History:  Procedure Laterality Date  . NO PAST SURGERIES    . WISDOM TOOTH EXTRACTION       OB History    Gravida  1   Para      Term      Preterm      AB      Living        SAB      IAB      Ectopic      Multiple      Live Births              Family History  Problem Relation Age of Onset  . Hyperlipidemia Mother   . Kidney Stones Mother   . Thyroid disease Mother     Social History   Tobacco Use  . Smoking status: Former Games developer  . Smokeless tobacco:  Never Used  . Tobacco comment: smoked lightly in the past  Vaping Use  . Vaping Use: Never used  Substance Use Topics  . Alcohol use: Yes    Comment: 5 drinks; 2-3 times per week  . Drug use: No    Home Medications Prior to Admission medications   Medication Sig Start Date End Date Taking? Authorizing Provider  chlordiazePOXIDE (LIBRIUM) 25 MG capsule Take 25 mg by mouth See admin instructions. Per pt every 3 days 09/13/20  Yes [provider]  LORazepam (ATIVAN) 1 MG tablet Take 1 mg by mouth daily.   Yes [provider]  LORazepam (ATIVAN) 1 MG tablet Take 1 tablet (1 mg total) by mouth 3 (three) times daily for 3 days, THEN 1 tablet (1 mg total) 2 (two) times daily for 3 days, THEN 0.5 tablets (0.5 mg total) 2 (two) times daily for 3 days. 09/20/20 09/29/20  Tonia Brooms, MD    Allergies    Patient has no known allergies.  Review of Systems   Review of  Systems  Constitutional: Negative for activity change and fever.  HENT: Negative for congestion.   Cardiovascular: Negative for chest pain.  Gastrointestinal: Negative for abdominal distention.  Genitourinary: Negative for flank pain.  Musculoskeletal: Negative for back pain.  Skin: Negative for pallor.  Neurological: Positive for seizures. Negative for weakness.  Psychiatric/Behavioral: Negative for suicidal ideas.    Physical Exam Updated Vital Signs BP 111/71   Pulse 100   Temp 98.6 F (37 C) (Oral)   Resp 20   SpO2 97%   Physical Exam Vitals and nursing note reviewed.  HENT:     Head: Atraumatic.     Mouth/Throat:     Mouth: Mucous membranes are moist.  Eyes:     Pupils: Pupils are equal, round, and reactive to light.  Cardiovascular:     Rate and Rhythm: Normal rate and regular rhythm.  Pulmonary:     Effort: Pulmonary effort is normal.  Abdominal:     Tenderness: There is no abdominal tenderness.  Musculoskeletal:        General: No tenderness.     Cervical back: Neck supple.  Skin:     General: Skin is warm.     Capillary Refill: Capillary refill takes less than 2 seconds.  Neurological:     Mental Status: She is alert and oriented to person, place, and time.  Psychiatric:        Behavior: Behavior normal.     ED Results / Procedures / Treatments   Labs (all labs ordered are listed, but only abnormal results are displayed) Labs Reviewed  COMPREHENSIVE METABOLIC PANEL - Abnormal; Notable for the following components:      Result Value   Calcium 8.7 (*)    AST 65 (*)    All other components within normal limits  ETHANOL - Abnormal; Notable for the following components:   Alcohol, Ethyl (B) 306 (*)    All other components within normal limits  CBC WITH DIFFERENTIAL/PLATELET - Abnormal; Notable for the following components:   WBC 3.6 (*)    MCV 104.3 (*)    MCH 35.0 (*)    Neutro Abs 1.3 (*)    All other components within normal limits  ACETAMINOPHEN LEVEL - Abnormal; Notable for the following components:   Acetaminophen (Tylenol), Serum <10 (*)    All other components within normal limits  SALICYLATE LEVEL - Abnormal; Notable for the following components:   Salicylate Lvl <7.0 (*)    All other components within normal limits  RAPID URINE DRUG SCREEN, HOSP PERFORMED  URINALYSIS, ROUTINE W REFLEX MICROSCOPIC  I-STAT BETA HCG BLOOD, ED ([facility], [facility], [facility] ONLY)    EKG None ED ECG REPORT   Date: 09/27/2020  Rate: 97  Rhythm: normal sinus rhythm  QRS Axis: normal  Intervals: normal  ST/T Wave abnormalities: normal  Conduction Disutrbances:none  Narrative Interpretation:   Old EKG Reviewed: none available  I have personally reviewed the EKG tracing and agree with the computerized printout as noted.  Radiology No results found.  Procedures Procedures   Medications Ordered in ED Medications - No data to display  ED Course  I have reviewed the triage vital signs and the nursing notes.  Pertinent labs & imaging results that were available during my care  of the patient were reviewed by me and considered in my medical decision making (see chart for details).    MDM Rules/Calculators/A&P  Patient came in under IVC.  Reportedly is from drinking alcohol and using drugs.  Patient states she is on a milligram of Ativan a day.  Reviewing drug database she should not be having Ativan every day was only on a taper after an ER visit 10 days ago.  Reportedly has been hallucinating and making suicidal statements.  IVC done by mother.  Patient is medically cleared.  Alcohol level is elevated.  Patient stated she not drink since yesterday.  Will have patient seen by TTS.  We will add CIWA.  Patient has a history of potential alcohol withdrawal seizures but also what appears to be nonepileptic seizure activity. Patient was Covid +10 days ago.  Should have completed isolation by this time.  Does not need further Covid testing. Final Clinical Impression(s) / ED Diagnoses Final diagnoses:  Alcohol abuse    Rx / DC Orders ED Discharge Orders    None       Benjiman Core, MD 09/27/20 2240    Benjiman Core, MD 09/27/20 2249

## 2020-09-27 NOTE — ED Triage Notes (Signed)
Pt arrived via EMS with police escort. Pt under IVC order. States recent breakup, per police,  Family stated pt has been manic, calling at all hours of the night not making sense. Pt tearful in triage.

## 2020-09-27 NOTE — ED Notes (Signed)
Patient belongings are in triage nursing station. Patient has one white bag, that contains her clothing, shoes and cell phone. Patient has changed into appropriate scrubs.

## 2020-09-27 NOTE — ED Notes (Signed)
Pt given hospital provided scrubs. Belongings and cell phone collected and placed in secure holding.

## 2020-09-27 NOTE — Progress Notes (Signed)
Date and time results received: 09/27/20 9:31 PM  Test:Ethanol Critical Value: 306  Name of Provider Notified: Rubin Payor MD  Orders Received? Or Actions Taken?: none at this time

## 2020-09-28 ENCOUNTER — Ambulatory Visit (HOSPITAL_COMMUNITY)
Admission: EM | Admit: 2020-09-28 | Discharge: 2020-09-29 | Disposition: A | Discharge status: Home / Self Care | Payer: No Payment, Other | Attending: Psychiatry | Admitting: Psychiatry

## 2020-09-28 ENCOUNTER — Other Ambulatory Visit: Payer: Self-pay

## 2020-09-28 ENCOUNTER — Encounter (HOSPITAL_COMMUNITY): Payer: Self-pay | Admitting: Registered Nurse

## 2020-09-28 DIAGNOSIS — Z87891 Personal history of nicotine dependence: Secondary | ICD-10-CM | POA: Insufficient documentation

## 2020-09-28 DIAGNOSIS — F4323 Adjustment disorder with mixed anxiety and depressed mood: Secondary | ICD-10-CM | POA: Diagnosis present

## 2020-09-28 DIAGNOSIS — F1020 Alcohol dependence, uncomplicated: Secondary | ICD-10-CM | POA: Diagnosis present

## 2020-09-28 DIAGNOSIS — Z79899 Other long term (current) drug therapy: Secondary | ICD-10-CM | POA: Insufficient documentation

## 2020-09-28 LAB — POC SARS CORONAVIRUS 2 AG -  ED: SARS Coronavirus 2 Ag: NEGATIVE

## 2020-09-28 MED ORDER — ACETAMINOPHEN 325 MG PO TABS: 650.0000 mg | ORAL_TABLET | Freq: Four times a day (QID) | ORAL | Status: DC | PRN

## 2020-09-28 MED ORDER — HYDROXYZINE HCL 25 MG PO TABS
25.0000 mg | ORAL_TABLET | Freq: Three times a day (TID) | ORAL | Status: DC | PRN
  Administered 2020-09-28: 25 mg via ORAL
  Filled 2020-09-28: qty 1

## 2020-09-28 MED ORDER — ALUM & MAG HYDROXIDE-SIMETH 200-200-20 MG/5ML PO SUSP: 30.0000 mL | ORAL | Status: DC | PRN

## 2020-09-28 MED ORDER — MAGNESIUM HYDROXIDE 400 MG/5ML PO SUSP: 30.0000 mL | Freq: Every day | ORAL | Status: DC | PRN

## 2020-09-28 MED ORDER — TRAZODONE HCL 50 MG PO TABS
50.0000 mg | ORAL_TABLET | Freq: Every evening | ORAL | Status: DC | PRN
  Administered 2020-09-28: 50 mg via ORAL
  Filled 2020-09-28: qty 1

## 2020-09-28 MED ORDER — GABAPENTIN 100 MG PO CAPS
200.0000 mg | ORAL_CAPSULE | Freq: Three times a day (TID) | ORAL | Status: DC
  Administered 2020-09-28 – 2020-09-29 (×3): 200 mg via ORAL
  Filled 2020-09-28: qty 2
  Filled 2020-09-28: qty 42
  Filled 2020-09-28 (×2): qty 2

## 2020-09-28 NOTE — Patient Outreach (Signed)
ED Peer Support Specialist Patient Intake (Complete at intake & 30-60 Day Follow-up)  Name: Tiffany Parsons  MRN: [id number]  Age: 30 y.o.   Date of Admission: 09/28/2020  Intake: Initial Comments:      Primary Reason Admitted: Alcohol Abuse   Lab values: Alcohol/ETOH: Positive Positive UDS? No Amphetamines: No Barbiturates: No Benzodiazepines: No Cocaine: No Opiates: No Cannabinoids: No  Demographic information: Gender: Female Ethnicity: White Marital Status: Unknown Insurance Status: Uninsured/Self-pay Ecologist (Work Neurosurgeon, Physicist, medical, etc.: Yes (foodstamps) Lives with: Alone Living situation: House/Apartment  Reported Patient History: Patient reported health conditions: Other (comment) Patient aware of HIV and hepatitis status: No  In past year, has patient visited ED for any reason? No  Number of ED visits:    Reason(s) for visit:    In past year, has patient been hospitalized for any reason? No  Number of hospitalizations:    Reason(s) for hospitalization:    In past year, has patient been arrested? No  Number of arrests:    Reason(s) for arrest:    In past year, has patient been incarcerated? No  Number of incarcerations:    Reason(s) for incarceration:    In past year, has patient received medication-assisted treatment? No  In past year, patient received the following treatments: Other (comment)  In past year, has patient received any harm reduction services? No  Did this include any of the following?    In past year, has patient received care from a mental health provider for diagnosis other than SUD? No  In past year, is this first time patient has overdosed? No  Number of past overdoses:    In past year, is this first time patient has been hospitalized for an overdose? No  Number of hospitalizations for overdose(s):    Is patient currently receiving treatment for a mental health diagnosis?  No  Patient reports experiencing difficulty participating in SUD treatment: No    Most important reason(s) for this difficulty?    Has patient received prior services for treatment? No  In past, patient has received services from following agencies:    Plan of Care:  Suggested follow up at these agencies/treatment centers: Other (comment)  Other information: [facility] met with Pt an was able to complete series of questions. Pt stated that she has encountered a situation with significant other an began to drink. Pt also mentioned that she has a job an wants to get back to work. CPSS will issue a list of resources for outpatient services as well [facility]PSS contact information to follow up with Pt in community.       Aaron Edelman Macie Baum, [place]  09/28/2020 2:07 PM

## 2020-09-28 NOTE — Discharge Instructions (Signed)
For your behavioral health needs, you are advised to follow up with [facility]:       [facility]      [address]      [place], [place] [address]      [phone]      Ask about their Substance Abuse Intensive Outpatient Program.  They also offer psychiatry/medication management, therapy and substance abuse counseling.  New patients are being seen in their walk-in clinic.  Walk-in hours are Monday - Thursday from 8:00 am - 11:00 am for psychiatry, and Friday from 1:00 pm - 4:00 pm for therapy.  Walk-in patients are seen on a first come, first served basis, so try to arrive as early as possible for the best chance of being seen the same day.

## 2020-09-28 NOTE — ED Provider Notes (Signed)
Behavioral Health Admission [facility]&P [facility] & OBS)  Date: 09/28/20 Patient Name: Tiffany Parsons MRN: [id number] Chief Complaint:  Chief Complaint  Patient presents with  . IVC  . Addiction Problem      Diagnoses:  Final diagnoses:  Adjustment disorder with mixed anxiety and depressed mood  Alcohol use disorder, moderate, in controlled environment [facility])    HPI: Tiffany Parsons, 30 y.o., female patient presents to [facility] for direct admit to continuous assessment from [facility] ED under IVC petition by her mother with complaints of auditory hallucinations, intoxication, and suicidal ideation.   Patient see earlier by this provider via tele health while at [facility] ED.  At this time patient is seen face to face by this provider, consulted with Dr. Bronwen Betters; and chart reviewed on 09/28/20.   On evaluation Tiffany Parsons continues to report she is not suicidal and knows she shouldn't have drank alcohol while taking her Ativan.  Patient states that she wants to go home.  Patient made aware that she was under IVC petition and would need to monitor her over night.   During evaluation Madalene Mickler is alert/oriented x 4; calm/cooperative; and mood anxious and somewhat irritable related to not being able to go home at this time.  Patient mood is congruent with affect.  She does not appear to be responding to internal/external stimuli or delusional thoughts.  Patient denies suicidal/self-harm/homicidal ideation, psychosis, and paranoia.  Patient answered question appropriately.   Previous Psychiatric Assessment while at [facility] ED and collateral information from patients mother:    HPI:  Tiffany Parsons, 40 y.o., female patient seen via tele health by this provider, consulted with Dr. Nelly Rout; and chart reviewed on 09/28/20.  On evaluation Tiffany Parsons reports she was brought to the hospital because she was forced to by her mother.  Patient states yesterday she was feeling overwhelmed "I've been going through a lot of stress lately.   I have medication that I take for stress and I was drinking alcohol yesterday.  I should drink alcohol when taking the medications; but I did yesterday and mixed together they affected me.  My mom had called to check on me and I said som pretty horrible things that I didn't mean.  She was really concerned about me."  Patient states that she lives alone and is employed.  States her family is really supportive.  Patient states at no time was she trying to kill herself yesterday "I love my life and I have a dog that I need to get home to."  Patient gave permission to speak to her mother for collateral information    During evaluation Tiffany Parsons is sitting up in bed in no acute distress.  She is alert, oriented x 4, calm and cooperative.  Her mood is euthymic with congruent affect.  She does not appear to be responding to internal/external stimuli or delusional thoughts.  Patient denies suicidal/self-harm/homicidal ideation, psychosis, and paranoia.  Patient answered question appropriately.  Patient doesn't feel that she needs inpatient or outpatient psychiatric services but is willing to take resources incase she chances her mind.  Collateral Information:  Spoke to patients mother [name] at [phone].  Ms. Shelva Majestic reports "It has been 3 weeks since Alenna broke up with her boyfriend and there has not been one day that she has not been completely drunk that anyone could even talk to her.  Yesterday she mixed her Ativan and alcohol together.  It really scared [place].  She lives by herself in  [place] 30 minutes from me."  Ms. Chad also reports that patient has a history of Xanax abuse but she always seems to get more.  States with her alcohol abuse history she has been given medication at one time that she could take from home.  Also reports that patient has a history of seizure.  Ms. Shelva Majestic states she is really concerned and doesn't feel that her daughter is ready to come home after just a couple of hours.  "I feel  like she needs to get help for her depression, detox, and then go to rehab or something.  Yesterday when I talked to her she told me she was going to kill her dog and then kill herself.  She threaten me.  I was really scared and I had no choice to go take out the IVC; she needs help."  Reports patient has had rehab before and was clean doing really well until this break up.   PHQ 2-9:   Flowsheet Row ED from 09/28/2020 in [facility] ED from 09/27/2020 in [facility] from 09/17/2020 in [facility] EMERGENCY DEPARTMENT  C-SSRS RISK CATEGORY No Risk Error: Question 6 not populated Error: Question 6 not populated       Total Time spent with patient: 30 minutes  Musculoskeletal  Strength & Muscle Tone: within normal limits Gait & Station: normal Patient leans: N/A  Psychiatric Specialty Exam  Presentation General Appearance: Appropriate for Environment  Eye Contact:Good  Speech:Clear and Coherent; Normal Rate  Speech Volume:Normal  Handedness:Right   Mood and Affect  Mood:Anxious; Irritable  Affect:Appropriate; Congruent   Thought Process  Thought Processes:Coherent; Goal Directed  Descriptions of Associations:Intact  Orientation:Full (Time, Place and Person)  Thought Content:No data recorded Hallucinations:Hallucinations: None  Ideas of Reference:Other (comment)  Suicidal Thoughts:Suicidal Thoughts: No  Homicidal Thoughts:Homicidal Thoughts: No   Sensorium  Memory:Immediate Good; Recent Good; Remote Good  Judgment:Intact  Insight:Present   Executive Functions  Concentration:Good  Attention Span:Good  Recall:Good  Fund of Knowledge:Good  Language:Good   Psychomotor Activity  Psychomotor Activity:No data recorded  Assets  Assets:Communication Skills; Desire for Improvement; Housing; Resilience; Social Support   Sleep  Sleep:Sleep: Good   Physical Exam Vitals  and nursing note reviewed. Exam conducted with a chaperone present.  Constitutional:      General: She is not in acute distress.    Appearance: Normal appearance. She is not ill-appearing.  HENT:     Head: Normocephalic.  Eyes:     Pupils: Pupils are equal, round, and reactive to light.  Cardiovascular:     Rate and Rhythm: Normal rate.  Pulmonary:     Effort: Pulmonary effort is normal.  Musculoskeletal:        General: Normal range of motion.     Cervical back: Normal range of motion.  Skin:    General: Skin is warm and dry.  Neurological:     Mental Status: She is alert and oriented to person, place, and time.  Psychiatric:        Attention and Perception: Attention and perception normal. She does not perceive auditory or visual hallucinations.        Mood and Affect: Mood and affect normal.        Speech: Speech normal.        Behavior: Behavior normal. Behavior is cooperative.        Thought Content: Thought content normal. Thought content is not paranoid or delusional. Thought content  does not include homicidal or suicidal ideation.        Cognition and Memory: Cognition and memory normal.        Judgment: Judgment is impulsive.    Review of Systems  Constitutional: Negative.   HENT: Negative.   Eyes: Negative.   Respiratory: Negative.   Cardiovascular: Negative.   Gastrointestinal: Negative.   Genitourinary: Negative.   Musculoskeletal: Negative.   Skin: Negative.   Neurological: Negative.   Endo/Heme/Allergies: Negative.   Psychiatric/Behavioral: Negative for hallucinations and memory loss. Depression: Stable. Substance abuse: Alcohol. Suicidal ideas: Denies. The patient does not have insomnia. Nervous/anxious: Stable.        Patient stating made comments she did not mean when she was intoxicated on alcohol and Ativan during verbal altercation with her mother.  States at no time did she want to die or try to kill herself    Blood pressure 133/89, pulse 100,  temperature 98 F (36.7 C), temperature source Oral, resp. rate 18, height 5\' 7"  (1.702 m), weight 59.9 kg, SpO2 98 %, unknown if currently breastfeeding. Body mass index is 20.67 kg/m.  Past Psychiatric History: Alcohol use disorder, PTSD, MDD   Is the patient at risk to self? No  Has the patient been a risk to self in the past 6 months? No .    Has the patient been a risk to self within the distant past? No   Is the patient a risk to others? No   Has the patient been a risk to others in the past 6 months? No   Has the patient been a risk to others within the distant past? No   Past Medical History:  Past Medical History:  Diagnosis Date  . Bacterial infection   . Medical history non-contributory     Past Surgical History:  Procedure Laterality Date  . NO PAST SURGERIES    . WISDOM TOOTH EXTRACTION      Family History:  Family History  Problem Relation Age of Onset  . Hyperlipidemia Mother   . Kidney Stones Mother   . Thyroid disease Mother     Social History:  Social History   Socioeconomic History  . Marital status: Single    Spouse name: Not on file  . Number of children: Not on file  . Years of education: Not on file  . Highest education level: Not on file  Occupational History  . Not on file  Tobacco Use  . Smoking status: Former Games developermoker  . Smokeless tobacco: Never Used  . Tobacco comment: smoked lightly in the past  Vaping Use  . Vaping Use: Never used  Substance and Sexual Activity  . Alcohol use: Yes    Comment: 5 drinks; 2-3 times per week  . Drug use: No  . Sexual activity: Yes  Other Topics Concern  . Not on file  Social History Narrative   Work or School: works at [employer]; [employer]      Home Situation: lives with Du Pontroomates      Spiritual Beliefs: Christian      Lifestyle: walks a lot; diet is healthy            Social Determinants of Corporate investment bankerHealth   Financial Resource Strain: Not on file  Food Insecurity: Not on file   Transportation Needs: Not on file  Physical Activity: Not on file  Stress: Not on file  Social Connections: Not on file  Intimate Partner Violence: Not on file    SDOH:  SDOH Screenings   Alcohol Screen: Not on file  Depression (ZOX0-9): Not on file  Financial Resource Strain: Not on file  Food Insecurity: Not on file  Housing: Not on file  Physical Activity: Not on file  Social Connections: Not on file  Stress: Not on file  Tobacco Use: Medium Risk  . Smoking Tobacco Use: Former Smoker  . Smokeless Tobacco Use: Never Used  Transportation Needs: Not on file    Last Labs:  Admission on 09/27/2020, Discharged on 09/28/2020  Component Date Value Ref Range Status  . Sodium 09/27/2020 141  135 - 145 mmol/L Final  . Potassium 09/27/2020 4.1  3.5 - 5.1 mmol/L Final  . Chloride 09/27/2020 102  98 - 111 mmol/L Final  . CO2 09/27/2020 26  22 - 32 mmol/L Final  . Glucose, Bld 09/27/2020 94  70 - 99 mg/dL Final   Glucose reference range applies only to samples taken after fasting for at least 8 hours.  . BUN 09/27/2020 6  6 - 20 mg/dL Final  . Creatinine, Ser 09/27/2020 0.52  0.44 - 1.00 mg/dL Final  . Calcium 60/45/4098 8.7* 8.9 - 10.3 mg/dL Final  . Total Protein 09/27/2020 8.0  6.5 - 8.1 g/dL Final  . Albumin 11/91/4782 4.8  3.5 - 5.0 g/dL Final  . AST 95/62/1308 65* 15 - 41 U/L Final  . ALT 09/27/2020 25  0 - 44 U/L Final  . Alkaline Phosphatase 09/27/2020 52  38 - 126 U/L Final  . Total Bilirubin 09/27/2020 0.6  0.3 - 1.2 mg/dL Final  . GFR, Estimated 09/27/2020 >60  >60 mL/min Final   Comment: (NOTE) Calculated using the CKD-EPI Creatinine Equation (2021)   . Anion gap 09/27/2020 13  5 - 15 Final   Performed at [facility], [address], [place], [place] [address]  . Alcohol, Ethyl (B) 09/27/2020 306* <10 mg/dL Final   Comment: CRITICAL RESULT CALLED TO, READ BACK BY AND VERIFIED WITH: SONIA LAMB @ 2127 ON 09/27/20 C VARNER (NOTE) Lowest detectable  limit for serum alcohol is 10 mg/dL.  For medical purposes only. Performed at [facility], [address], [place], [place] [address]   . Opiates 09/27/2020 NONE DETECTED  NONE DETECTED Final  . Cocaine 09/27/2020 NONE DETECTED  NONE DETECTED Final  . Benzodiazepines 09/27/2020 POSITIVE* NONE DETECTED Final  . Amphetamines 09/27/2020 NONE DETECTED  NONE DETECTED Final  . Tetrahydrocannabinol 09/27/2020 NONE DETECTED  NONE DETECTED Final  . Barbiturates 09/27/2020 NONE DETECTED  NONE DETECTED Final   Comment: (NOTE) DRUG SCREEN FOR MEDICAL PURPOSES ONLY.  IF CONFIRMATION IS NEEDED FOR ANY PURPOSE, NOTIFY LAB WITHIN 5 DAYS.  LOWEST DETECTABLE LIMITS FOR URINE DRUG SCREEN Drug Class                     Cutoff (ng/mL) Amphetamine and metabolites    1000 Barbiturate and metabolites    200 Benzodiazepine                 200 Tricyclics and metabolites     300 Opiates and metabolites        300 Cocaine and metabolites        300 THC                            50 Performed at [facility], [address] [place], [place] [address]   . WBC 09/27/2020 3.6*  4.0 - 10.5 K/uL Final  . RBC 09/27/2020 4.00  3.87 - 5.11 MIL/uL Final  . Hemoglobin 09/27/2020 14.0  12.0 - 15.0 g/dL Final  . HCT 69/62/9528 41.7  36.0 - 46.0 % Final  . MCV 09/27/2020 104.3* 80.0 - 100.0 fL Final  . MCH 09/27/2020 35.0* 26.0 - 34.0 pg Final  . MCHC 09/27/2020 33.6  30.0 - 36.0 g/dL Final  . RDW 41/32/4401 12.1  11.5 - 15.5 % Final  . Platelets 09/27/2020 231  150 - 400 K/uL Final  . nRBC 09/27/2020 0.0  0.0 - 0.2 % Final  . Neutrophils Relative % 09/27/2020 36  % Final  . Neutro Abs 09/27/2020 1.3* 1.7 - 7.7 K/uL Final  . Lymphocytes Relative 09/27/2020 54  % Final  . Lymphs Abs 09/27/2020 2.0  0.7 - 4.0 K/uL Final  . Monocytes Relative 09/27/2020 9  % Final  . Monocytes Absolute 09/27/2020 0.3  0.1 - 1.0 K/uL Final  . Eosinophils Relative 09/27/2020 0  % Final  .  Eosinophils Absolute 09/27/2020 0.0  0.0 - 0.5 K/uL Final  . Basophils Relative 09/27/2020 1  % Final  . Basophils Absolute 09/27/2020 0.0  0.0 - 0.1 K/uL Final  . Immature Granulocytes 09/27/2020 0  % Final  . Abs Immature Granulocytes 09/27/2020 0.00  0.00 - 0.07 K/uL Final   Performed at [facility], [address] [place], [place] [address]  . I-stat hCG, quantitative 09/27/2020 <5.0  <5 mIU/mL Final  . Comment 3 09/27/2020          Final   Comment:   GEST. AGE      CONC.  (mIU/mL)   <=1 WEEK        5 - 50     2 WEEKS       50 - 500     3 WEEKS       100 - 10,000     4 WEEKS     1,000 - 30,000        FEMALE AND NON-PREGNANT FEMALE:     LESS THAN 5 mIU/mL   . Acetaminophen (Tylenol), Serum 09/27/2020 <10* 10 - 30 ug/mL Final   Comment: (NOTE) Therapeutic concentrations vary significantly. A range of 10-30 ug/mL  may be an effective concentration for many patients. However, some  are best treated at concentrations outside of this range. Acetaminophen concentrations >150 ug/mL at 4 hours after ingestion  and >50 ug/mL at 12 hours after ingestion are often associated with  toxic reactions.  Performed at [facility], [address], [place], [place] [address]   . Salicylate Lvl 09/27/2020 <7.0* 7.0 - 30.0 mg/dL Final   Performed at [facility], [address] [place], [place] [address]  . Color, Urine 09/27/2020 YELLOW  YELLOW Final  . APPearance 09/27/2020 CLEAR  CLEAR Final  . Specific Gravity, Urine 09/27/2020 1.017  1.005 - 1.030 Final  . pH 09/27/2020 5.0  5.0 - 8.0 Final  . Glucose, UA 09/27/2020 NEGATIVE  NEGATIVE mg/dL Final  . Hgb urine dipstick 09/27/2020 SMALL* NEGATIVE Final  . Bilirubin Urine 09/27/2020 NEGATIVE  NEGATIVE Final  . Ketones, ur 09/27/2020 NEGATIVE  NEGATIVE mg/dL Final  . Protein, ur 25/95/6387 NEGATIVE  NEGATIVE mg/dL Final  . Nitrite 56/43/3295 NEGATIVE  NEGATIVE Final  . Glori Luis  09/27/2020 NEGATIVE  NEGATIVE Final  . RBC / HPF 09/27/2020 0-5  0 - 5 RBC/hpf Final  . WBC, UA 09/27/2020 0-5  0 -  5 WBC/hpf Final  . Bacteria, UA 09/27/2020 NONE SEEN  NONE SEEN Final  . Squamous Epithelial / LPF 09/27/2020 0-5  0 - 5 Final  . Mucus 09/27/2020 PRESENT   Final   Performed at [facility], [address] [place], [place] [address]  . SARS Coronavirus 2 Ag 09/28/2020 NEGATIVE  NEGATIVE Final   Comment: (NOTE) SARS-CoV-2 antigen NOT DETECTED.   Negative results are presumptive.  Negative results do not preclude SARS-CoV-2 infection and should not be used as the sole basis for treatment or other patient management decisions, including infection  control decisions, particularly in the presence of clinical signs and  symptoms consistent with COVID-19, or in those who have been in contact with the virus.  Negative results must be combined with clinical observations, patient history, and epidemiological information. The expected result is Negative.  Fact Sheet for Patients: https://www.jennings-kim.com/  Fact Sheet for Healthcare Providers: https://alexander-rogers.biz/  This test is not yet approved or cleared by the [place] FDA and  has been authorized for detection and/or diagnosis of SARS-CoV-2 by FDA under an Emergency Use Authorization (EUA).  This EUA will remain in effect (meaning this test can be used) for the duration of  the COV                          ID-19 declaration under Section 564(b)(1) of the Act, 21 U.S.C. section 360bbb-3(b)(1), unless the authorization is terminated or revoked sooner.    Admission on 09/17/2020, Discharged on 09/17/2020  Component Date Value Ref Range Status  . Sodium 09/17/2020 137  135 - 145 mmol/L Final  . Potassium 09/17/2020 3.8  3.5 - 5.1 mmol/L Final  . Chloride 09/17/2020 100  98 - 111 mmol/L Final  . CO2 09/17/2020 21* 22 - 32 mmol/L Final  . Glucose, Bld 09/17/2020  109* 70 - 99 mg/dL Final   Glucose reference range applies only to samples taken after fasting for at least 8 hours.  . BUN 09/17/2020 <5* 6 - 20 mg/dL Final  . Creatinine, Ser 09/17/2020 0.43* 0.44 - 1.00 mg/dL Final  . Calcium 03/47/4259 9.2  8.9 - 10.3 mg/dL Final  . Total Protein 09/17/2020 6.9  6.5 - 8.1 g/dL Final  . Albumin 56/38/7564 4.4  3.5 - 5.0 g/dL Final  . AST 33/29/5188 61* 15 - 41 U/L Final  . ALT 09/17/2020 25  0 - 44 U/L Final  . Alkaline Phosphatase 09/17/2020 48  38 - 126 U/L Final  . Total Bilirubin 09/17/2020 1.2  0.3 - 1.2 mg/dL Final  . GFR, Estimated 09/17/2020 >60  >60 mL/min Final   Comment: (NOTE) Calculated using the CKD-EPI Creatinine Equation (2021)   . Anion gap 09/17/2020 16* 5 - 15 Final   Performed at [facility], [address], [place], [place] [address]  . Alcohol, Ethyl (B) 09/17/2020 97* <10 mg/dL Final   Comment: (NOTE) Lowest detectable limit for serum alcohol is 10 mg/dL.  For medical purposes only. Performed at [facility], [address] [place], [place] [address]   . WBC 09/17/2020 4.7  4.0 - 10.5 K/uL Final  . RBC 09/17/2020 3.80* 3.87 - 5.11 MIL/uL Final  . Hemoglobin 09/17/2020 13.6  12.0 - 15.0 g/dL Final  . HCT 08/15/3233 38.0  36.0 - 46.0 % Final  . MCV 09/17/2020 100.0  80.0 - 100.0 fL Final  . MCH 09/17/2020 35.8* 26.0 - 34.0 pg Final  .  MCHC 09/17/2020 35.8  30.0 - 36.0 g/dL Final  . RDW 29/52/8413 10.9* 11.5 - 15.5 % Final  . Platelets 09/17/2020 170  150 - 400 K/uL Final  . nRBC 09/17/2020 0.0  0.0 - 0.2 % Final   Performed at [facility], [address] [place], [place] [address]  . I-stat hCG, quantitative 09/17/2020 <5.0  <5 mIU/mL Final  . Comment 3 09/17/2020          Final   Comment:   GEST. AGE      CONC.  (mIU/mL)   <=1 WEEK        5 - 50     2 WEEKS       50 - 500     3 WEEKS       100 - 10,000     4 WEEKS     1,000 - 30,000        FEMALE AND NON-PREGNANT FEMALE:     LESS THAN 5  mIU/mL   . SARS Coronavirus 2 by RT PCR 09/17/2020 POSITIVE* NEGATIVE Final   Comment: RESULT CALLED TO, READ BACK BY AND VERIFIED WITH: C BLACK RN 09/17/20 2210 JDW (NOTE) SARS-CoV-2 target nucleic acids are DETECTED.  The SARS-CoV-2 RNA is generally detectable in upper respiratory specimens during the acute phase of infection. Positive results are indicative of the presence of the identified virus, but do not rule out bacterial infection or co-infection with other pathogens not detected by the test. Clinical correlation with patient history and other diagnostic information is necessary to determine patient infection status. The expected result is Negative.  Fact Sheet for Patients: BloggerCourse.com  Fact Sheet for Healthcare Providers: SeriousBroker.it  This test is not yet approved or cleared by the [place] FDA and  has been authorized for detection and/or diagnosis of SARS-CoV-2 by FDA under an Emergency Use Authorization (EUA).  This EUA will remain in effect (meaning this test can be used)                           for the duration of  the COVID-19 declaration under Section 564(b)(1) of the Act, 21 U.S.C. section 360bbb-3(b)(1), unless the authorization is terminated or revoked sooner.    . Influenza A by PCR 09/17/2020 NEGATIVE  NEGATIVE Final  . Influenza B by PCR 09/17/2020 NEGATIVE  NEGATIVE Final   Comment: (NOTE) The Xpert Xpress SARS-CoV-2/FLU/RSV plus assay is intended as an aid in the diagnosis of influenza from Nasopharyngeal swab specimens and should not be used as a sole basis for treatment. Nasal washings and aspirates are unacceptable for Xpert Xpress SARS-CoV-2/FLU/RSV testing.  Fact Sheet for Patients: BloggerCourse.com  Fact Sheet for Healthcare Providers: SeriousBroker.it  This test is not yet approved or cleared by the [place] FDA  and has been authorized for detection and/or diagnosis of SARS-CoV-2 by FDA under an Emergency Use Authorization (EUA). This EUA will remain in effect (meaning this test can be used) for the duration of the COVID-19 declaration under Section 564(b)(1) of the Act, 21 U.S.C. section 360bbb-3(b)(1), unless the authorization is terminated or revoked.  Performed at [facility], [address] [place], [place] [address]     Allergies: Patient has no known allergies.      PTA Medications:  Medication Details Provider Last Reconciliation Status  chlordiazepoxide (LIBRIUM) 25 MG capsule Take 25 mg by mouth See admin instructions. Per pt. every 3 days Makella Buckingham  B, NP Not Ordered  Lorazepam (ATIVAN) 1 MG tablet Take 1 tablet (1 mg total) by mouth 3 (three) times daily for 3 days, THEN 1 tablet (1 mg total) 2 (two) times daily for 3 days, THEN 0.5 tablets (0.5 mg total) 2 (two) times daily for 3 days. Barbaraann Avans B, NP Not Ordered  Lorazepam (ATIVAN) 1 MG tablet Take 1 mg by mouth daily.      Medical Decision Making  Patient admitted to Continuous Assessment Unit for safety and stabilization  Routine labs reviewed  Medication Management:   Gabapentin 200 mg Tid for alcohol use disorder Vistaril 25 mg Tid prn anxiety Trazodone 50 mg Q hs prn sleep Did not order Librium or Ativan detox protocol related to patients' history of benzodiazepine abuse and patients mothers concern that patient always finds a way to get them.    Recommendations  Based on my evaluation the patient does not appear to have an emergency medical condition.  Ambrielle Kington, NP 09/28/20  3:55 PM

## 2020-09-28 NOTE — ED Notes (Addendum)
Pt calm and cooperative. Alert and orient x 4. No c/o of pain or distress. Will continue to monitor for safety 
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
## 2020-09-28 NOTE — ED Notes (Signed)
Pt sleeping@this time. Breathing even and unlabored. No c/o of pain or distress. Will continue to monitor for safety 

## 2020-09-28 NOTE — ED Notes (Signed)
Report called to RN at [facility] called for transport

## 2020-09-28 NOTE — BH Assessment (Addendum)
Comprehensive Clinical Assessment (CCA) Note  09/28/2020 Tiffany Parsons [id number]   Tiffany Parsons is a 30 year old female presenting to under IVC to [facility] due to SI, intoxication and hallucinations. Per IVC paperwork has been drinking heavily and mixing with prescription medications.  Per IVC paperwork she has been seeing things and hallucinating. Per IVC paperwork patient has been saying that she wants to die. Patient denied heavily drinking, with last drink on yesterday. BAL 306. Patient reported she does not mix medication with alcohol, stated she just takes Ativan for anxiety. Patient denied having any hallucinations. Patient denied SI and depressive symptoms and states she just wants to be home alone and sleep.  Patient stated "I experienced a traumatic experience earlier this month, so I am grieving that relationship". Patient reported coming home from work and finding her fiance in the bed having sex with a man. Patient reported feeling disgusted, went to hospital and tested positive for COVID. Patient reported she continues to feel the same way.   Patient is currently seeing Dr. Sheryle Hail for medication management. Patient reports taking Ativan. Patient denied receiving another outpatient mental health services. Patient denied prior inpatient psych treatment, past suicide attempts and self-harming behaviors. Patient reported 8 hours sleep and poor appetite.   Collateral contact, [name], per patient and triage, mother is petitioner on IVC paperwork, [phone], unable to make contact. Will attempt at later time.  Disposition [name], NP, disposition pending IVC paperwork and collateral contact. Collateral contact, [name], per patient and triage, mother is petitioner on IVC paperwork, [phone], unable to make contact, awaiting call. Will attempt again.  Chief Complaint:  Chief Complaint  Patient presents with  . Psychiatric Evaluation  . IVC   Visit Diagnosis: Alcohol   CCA  Biopsychosocial Intake/Chief Complaint:  Per IVC, SI, intoxication and hallucinating.  Current Symptoms/Problems: Patient denies.  Patient Reported Schizophrenia/Schizoaffective Diagnosis in Past: No  Strengths: education  Preferences: uta  Abilities: uta  Type of Services Patient Feels are Needed: "none, I am fine"  Initial Clinical Notes/Concerns: No data recorded  Mental Health Symptoms Depression:  None   Duration of Depressive symptoms: No data recorded  Mania:  None   Anxiety:   None   Psychosis:  None   Duration of Psychotic symptoms: No data recorded  Trauma:  Re-experience of traumatic event   Obsessions:  None   Compulsions:  None   Inattention:  None   Hyperactivity/Impulsivity:  N/A   Oppositional/Defiant Behaviors:  None   Emotional Irregularity:  None   Other Mood/Personality Symptoms:  No data recorded   Mental Status Exam Appearance and self-care  Stature:  Average   Weight:  Average weight   Clothing:  Age-appropriate   Grooming:  Normal   Cosmetic use:  None   Posture/gait:  Normal   Motor activity:  Not Remarkable   Sensorium  Attention:  Normal   Concentration:  Normal   Orientation:  X5   Recall/memory:  Normal   Affect and Mood  Affect:  Anxious; Appropriate   Mood:  Anxious   Relating  Eye contact:  Normal   Facial expression:  Anxious   Attitude toward examiner:  Cooperative   Thought and Language  Speech flow: Normal   Thought content:  Appropriate to Mood and Circumstances   Preoccupation:  None   Hallucinations:  None   Organization:  No data recorded  [employer] of Knowledge:  Average   Intelligence:  Average   Abstraction:  Normal   Judgement:  Impaired   Reality Testing:  No data recorded  Insight:  Poor   Decision Making:  Impulsive   Social Functioning  Social Maturity:  No data recorded  Social Judgement:  No data recorded  Stress  Stressors:  Relationship    Coping Ability:  Human resources officer Deficits:  Decision making   Supports:  Family; Friends/Service system    Religion:   Leisure/Recreation:   Exercise/Diet: Exercise/Diet Do You Follow a Special Diet?: No Do You Have Any Trouble Sleeping?: No  CCA Employment/Education Employment/Work Situation: Employment / 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 been impacted by current illness: No Has patient ever been in the [employer]?: No  Education: Education Is Patient Currently Attending School?: No Last Grade Completed: 16 Did Garment/textile technologist From [employer]?: Yes Did Theme park manager?: Yes What Type of College Degree Do you Have?: 4 year degree Did You Have An Individualized Education Program (IIEP): No Did You Have Any Difficulty At School?: No Patient's Education Has Been Impacted by Current Illness: No  CCA Family/Childhood History Family and Relationship History: Family history Does patient have children?: No  Childhood History:  Childhood History By whom was/is the patient raised?: Mother,Father Description of patient's relationship with caregiver when they were a child: good Patient's description of current relationship with people who raised him/her: good How were you disciplined when you got in trouble as a child/adolescent?: n/a Does patient have siblings?: Yes Number of Siblings: 4 Description of patient's current relationship with siblings: normal Did patient suffer any verbal/emotional/physical/sexual abuse as a child?: No Did patient suffer from severe childhood neglect?: No Has patient ever been sexually abused/assaulted/raped as an adolescent or adult?: No Was the patient ever a victim of a crime or a disaster?: No Witnessed domestic violence?: No Has patient been affected by domestic violence as an adult?: No  Child/Adolescent Assessment:   CCA Substance Use Alcohol/Drug Use: Alcohol / Drug Use Pain Medications: see MAR Prescriptions: see MAR Over the  Counter: see MAR History of alcohol / drug use?: Yes Substance #1 Name of Substance 1: alcohol 1 - Amount (size/oz): unknown 1 - Frequency: binge 1 - Last Use / Amount: today   ASAM's:  Six Dimensions of Multidimensional Assessment  Dimension 1:  Acute Intoxication and/or Withdrawal Potential:      Dimension 2:  Biomedical Conditions and Complications:      Dimension 3:  Emotional, Behavioral, or Cognitive Conditions and Complications:     Dimension 4:  Readiness to Change:     Dimension 5:  Relapse, Continued use, or Continued Problem Potential:     Dimension 6:  Recovery/Living Environment:     ASAM Severity Score:    ASAM Recommended Level of Treatment:     Substance use Disorder (SUD)   Recommendations for Services/Supports/Treatments:   DSM5 Diagnoses: Patient Active Problem List   Diagnosis Date Noted  . Miscarriage 10/29/2018  . Ganglion cyst of wrist 05/19/2014   Patient Centered Plan: Patient is on the following Treatment Plan(s):    Referrals to Alternative Service(s): Referred to Alternative Service(s):   Place:   Date:   Time:    Referred to Alternative Service(s):   Place:   Date:   Time:    Referred to Alternative Service(s):   Place:   Date:   Time:    Referred to Alternative Service(s):   Place:   Date:   Time:     Burnetta Sabin, [facility]

## 2020-09-28 NOTE — ED Notes (Signed)
Pt states she needs her Lorazepam so she will not have a seizure. States if she stops taking it she will have a seizure. Pt heaving at intervals, 10 on CIWA 1 mg Lorazepam with Thiamine. Pt requesting cell phone, she is aware at discharge her valuables will be given back to her.

## 2020-09-28 NOTE — BH Assessment (Addendum)
[facility] Assessment Progress Note   Per Shuvon Rankin, NP, pt is to be is transferred to the [facility].  Pt presents under IVC initiated by pt's mother and upheld by EDP Marily Memos, MD.  Please call report to [phone].  Pt is to be transported via [employer] [facility].  EDP Jacalyn Lefevre, MD and pt's nurse, Silvio Pate, have been notified.  At [facility] request, this writer spoke to pt about enrolling in a substance abuse treatment program.  She denies having a substance abuse problem needing treatment at this time.  Information about the Substance Abuse Intensive Outpatient Program at [facility] has been included in pt's discharge instructions, as well as information about other services that they provide.  Doylene Canning, [place] [facility] Coordinator [phone]

## 2020-09-28 NOTE — Consult Note (Signed)
Telepsych Consultation   Reason for Consult:  IVC,  Referring Physician:  Benjiman Core, MD Location of Patient:  Location of Provider: Other: [facility]  Patient Identification: Tiffany Parsons MRN:  [id number] Principal Diagnosis: Adjustment disorder with mixed anxiety and depressed mood Diagnosis:  Principal Problem:   Adjustment disorder with mixed anxiety and depressed mood Active Problems:   Alcohol use disorder, moderate, in controlled environment (HCC)   Total Time spent with patient: 30 minutes  Subjective:   Tiffany Parsons is a 30 y.o. female patient admitted [facility] ED under IVC petition by her mother with complaints of suicidal ideation, intoxication, and hallucinations.  HPI:  Tiffany Parsons, 30 y.o., female patient seen via tele health by this provider, consulted with Dr. Nelly Rout; and chart reviewed on 09/28/20.  On evaluation Tiffany Parsons reports she was brought to the hospital because she was forced to by her mother.  Patient states yesterday she was feeling overwhelmed "I've been going through a lot of stress lately.  I have medication that I take for stress and I was drinking alcohol yesterday.  I should drink alcohol when taking the medications; but I did yesterday and mixed together they affected me.  My mom had called to check on me and I said som pretty horrible things that I didn't mean.  She was really concerned about me."  Patient states that she lives alone and is employed.  States her family is really supportive.  Patient states at no time was she trying to kill herself yesterday "I love my life and I have a dog that I need to get home to."  Patient gave permission to speak to her mother for collateral information    During evaluation Tiffany Parsons is sitting up in bed in no acute distress.  She is alert, oriented x 4, calm and cooperative.  Her mood is euthymic with congruent affect.  She does not appear to be responding to internal/external stimuli or delusional thoughts.   Patient denies suicidal/self-harm/homicidal ideation, psychosis, and paranoia.  Patient answered question appropriately.  Patient doesn't feel that she needs inpatient or outpatient psychiatric services but is willing to take resources incase she chances her mind.  Collateral Information:  Spoke to patients mother Tiffany Parsons at [phone].  Ms. Shelva Majestic reports "It has been 3 weeks since Tiffany Parsons broke up with her boyfriend and there has not been one day that she has not been completely drunk that anyone could even talk to her.  Yesterday she mixed her Ativan and alcohol together.  It really scared [place].  She lives by herself in [place] 30 minutes from me."  Ms. Chad also reports that patient has a history of Xanax abuse but she always seems to get more.  States with her alcohol abuse history she has been given medication at one time that she could take from home.  Also reports that patient has a history of seizure.  Ms. Shelva Majestic states she is really concerned and doesn't feel that her daughter is ready to come home after just a couple of hours.  "I feel like she needs to get help for her depression, detox, and then go to rehab or something.  Yesterday when I talked to her she told me she was going to kill her dog and then kill herself.  She threaten me.  I was really scared and I had no choice to go take out the IVC; she needs help."  Reports patient has had rehab before and was clean doing  really well until this break up.     Past Psychiatric History: Alcohol use disorder, Xanax use disorder, Major depression  Risk to Self:  Yes Risk to Others:  No Prior Inpatient Therapy:  Yes Prior Outpatient Therapy:  Yes  Past Medical History:  Past Medical History:  Diagnosis Date  . Bacterial infection   . Medical history non-contributory     Past Surgical History:  Procedure Laterality Date  . NO PAST SURGERIES    . WISDOM TOOTH EXTRACTION     Family History:  Family History  Problem Relation Age of Onset   . Hyperlipidemia Mother   . Kidney Stones Mother   . Thyroid disease Mother    Family Psychiatric  History: Denies Social History:  Social History   Substance and Sexual Activity  Alcohol Use Yes   Comment: 5 drinks; 2-3 times per week     Social History   Substance and Sexual Activity  Drug Use No    Social History   Socioeconomic History  . Marital status: Single    Spouse name: Not on file  . Number of children: Not on file  . Years of education: Not on file  . Highest education level: Not on file  Occupational History  . Not on file  Tobacco Use  . Smoking status: Former Games developermoker  . Smokeless tobacco: Never Used  . Tobacco comment: smoked lightly in the past  Vaping Use  . Vaping Use: Never used  Substance and Sexual Activity  . Alcohol use: Yes    Comment: 5 drinks; 2-3 times per week  . Drug use: No  . Sexual activity: Yes  Other Topics Concern  . Not on file  Social History Narrative   Work or School: works at [employer]; [employer]      Home Situation: lives with [place] Pont[place]ates      Spiritual Beliefs: Christian      Lifestyle: walks a lot; diet is healthy            Social Determinants of Corporate investment bankerHealth   Financial Resource Strain: Not on file  Food Insecurity: Not on file  Transportation Needs: Not on file  Physical Activity: Not on file  Stress: Not on file  Social Connections: Not on file   Additional Social History:    Allergies:  No Known Allergies  Labs:  Results for orders placed or performed during the hospital encounter of 09/27/20 (from the past 48 hour(s))  Comprehensive metabolic panel     Status: Abnormal   Collection Time: 09/27/20  8:33 PM  Result Value Ref Range   Sodium 141 135 - 145 mmol/L   Potassium 4.1 3.5 - 5.1 mmol/L   Chloride 102 98 - 111 mmol/L   CO2 26 22 - 32 mmol/L   Glucose, Bld 94 70 - 99 mg/dL    Comment: Glucose reference range applies only to samples taken after fasting for at least 8 hours.   BUN  6 6 - 20 mg/dL   Creatinine, Ser 1.610.52 0.44 - 1.00 mg/dL   Calcium 8.7 (L) 8.9 - 10.3 mg/dL   Total Protein 8.0 6.5 - 8.1 g/dL   Albumin 4.8 3.5 - 5.0 g/dL   AST 65 (H) 15 - 41 U/L   ALT 25 0 - 44 U/L   Alkaline Phosphatase 52 38 - 126 U/L   Total Bilirubin 0.6 0.3 - 1.2 mg/dL   GFR, Estimated >09>60 >60>60 mL/min    Comment: (NOTE) Calculated using the  CKD-EPI Creatinine Equation (2021)    Anion gap 13 5 - 15    Comment: Performed at [facility], [address], [place], [place] [address]  Ethanol     Status: Abnormal   Collection Time: 09/27/20  8:33 PM  Result Value Ref Range   Alcohol, Ethyl (B) 306 (HH) <10 mg/dL    Comment: CRITICAL RESULT CALLED TO, READ BACK BY AND VERIFIED WITH: SONIA LAMB @ 2127 ON 09/27/20 C VARNER (NOTE) Lowest detectable limit for serum alcohol is 10 mg/dL.  For medical purposes only. Performed at [facility], [address] [place], [place] [address]   CBC with Differential     Status: Abnormal   Collection Time: 09/27/20  8:33 PM  Result Value Ref Range   WBC 3.6 (L) 4.0 - 10.5 K/uL   RBC 4.00 3.87 - 5.11 MIL/uL   Hemoglobin 14.0 12.0 - 15.0 g/dL   HCT 36.6 44.0 - 34.7 %   MCV 104.3 (H) 80.0 - 100.0 fL   MCH 35.0 (H) 26.0 - 34.0 pg   MCHC 33.6 30.0 - 36.0 g/dL   RDW 42.5 95.6 - 38.7 %   Platelets 231 150 - 400 K/uL   nRBC 0.0 0.0 - 0.2 %   Neutrophils Relative % 36 %   Neutro Abs 1.3 (L) 1.7 - 7.7 K/uL   Lymphocytes Relative 54 %   Lymphs Abs 2.0 0.7 - 4.0 K/uL   Monocytes Relative 9 %   Monocytes Absolute 0.3 0.1 - 1.0 K/uL   Eosinophils Relative 0 %   Eosinophils Absolute 0.0 0.0 - 0.5 K/uL   Basophils Relative 1 %   Basophils Absolute 0.0 0.0 - 0.1 K/uL   Immature Granulocytes 0 %   Abs Immature Granulocytes 0.00 0.00 - 0.07 K/uL    Comment: Performed at [facility], [address], [place], [place] [address]  Acetaminophen level     Status: Abnormal   Collection Time:  09/27/20  8:33 PM  Result Value Ref Range   Acetaminophen (Tylenol), Serum <10 (L) 10 - 30 ug/mL    Comment: (NOTE) Therapeutic concentrations vary significantly. A range of 10-30 ug/mL  may be an effective concentration for many patients. However, some  are best treated at concentrations outside of this range. Acetaminophen concentrations >150 ug/mL at 4 hours after ingestion  and >50 ug/mL at 12 hours after ingestion are often associated with  toxic reactions.  Performed at [facility], [address] [place], [place] [address]   Salicylate level     Status: Abnormal   Collection Time: 09/27/20  8:33 PM  Result Value Ref Range   Salicylate Lvl <7.0 (L) 7.0 - 30.0 mg/dL    Comment: Performed at [facility], [address], [place], [place] [address]  I-Stat beta hCG blood, ED     Status: None   Collection Time: 09/27/20  8:44 PM  Result Value Ref Range   I-stat hCG, quantitative <5.0 <5 mIU/mL   Comment 3            Comment:   GEST. AGE      CONC.  (mIU/mL)   <=1 WEEK        5 - 50     2 WEEKS       50 - 500     3 WEEKS       100 - 10,000     4 WEEKS  1,000 - 30,000        FEMALE AND NON-PREGNANT FEMALE:     LESS THAN 5 mIU/mL   Urine rapid drug screen (hosp performed)     Status: Abnormal   Collection Time: 09/27/20  9:23 PM  Result Value Ref Range   Opiates NONE DETECTED NONE DETECTED   Cocaine NONE DETECTED NONE DETECTED   Benzodiazepines POSITIVE (A) NONE DETECTED   Amphetamines NONE DETECTED NONE DETECTED   Tetrahydrocannabinol NONE DETECTED NONE DETECTED   Barbiturates NONE DETECTED NONE DETECTED    Comment: (NOTE) DRUG SCREEN FOR MEDICAL PURPOSES ONLY.  IF CONFIRMATION IS NEEDED FOR ANY PURPOSE, NOTIFY LAB WITHIN 5 DAYS.  LOWEST DETECTABLE LIMITS FOR URINE DRUG SCREEN Drug Class                     Cutoff (ng/mL) Amphetamine and metabolites    1000 Barbiturate and metabolites    200 Benzodiazepine                  200 Tricyclics and metabolites     300 Opiates and metabolites        300 Cocaine and metabolites        300 THC                            50 Performed at [facility], [address], [place], [place] [address]   Urinalysis, Routine w reflex microscopic     Status: Abnormal   Collection Time: 09/27/20  9:23 PM  Result Value Ref Range   Color, Urine YELLOW YELLOW   APPearance CLEAR CLEAR   Specific Gravity, Urine 1.017 1.005 - 1.030   pH 5.0 5.0 - 8.0   Glucose, UA NEGATIVE NEGATIVE mg/dL   Hgb urine dipstick SMALL (A) NEGATIVE   Bilirubin Urine NEGATIVE NEGATIVE   Ketones, ur NEGATIVE NEGATIVE mg/dL   Protein, ur NEGATIVE NEGATIVE mg/dL   Nitrite NEGATIVE NEGATIVE   Leukocytes,Ua NEGATIVE NEGATIVE   RBC / HPF 0-5 0 - 5 RBC/hpf   WBC, UA 0-5 0 - 5 WBC/hpf   Bacteria, UA NONE SEEN NONE SEEN   Squamous Epithelial / LPF 0-5 0 - 5   Mucus PRESENT     Comment: Performed at [facility], [address] [place], [place] [address]    Medications:  Current Facility-Administered Medications  Medication Dose Route Frequency Provider Last Rate Last Admin  . LORazepam (ATIVAN) injection 0-4 mg  0-4 mg Intravenous Q6H Benjiman Core, MD       Or  . LORazepam (ATIVAN) tablet 0-4 mg  0-4 mg Oral Q6H Benjiman Core, MD   1 mg at 09/28/20 1043  . [START ON 09/30/2020] LORazepam (ATIVAN) injection 0-4 mg  0-4 mg Intravenous Orma Render, MD       Or  . Melene Muller ON 09/30/2020] LORazepam (ATIVAN) tablet 0-4 mg  0-4 mg Oral Q12H Benjiman Core, MD      . thiamine tablet 100 mg  100 mg Oral Daily Benjiman Core, MD   100 mg at 09/28/20 1043   Or  . thiamine (B-1) injection 100 mg  100 mg Intravenous Daily Benjiman Core, MD       Current Outpatient Medications  Medication Sig Dispense Refill  . chlordiazePOXIDE (LIBRIUM) 25 MG capsule Take 25 mg by mouth See admin instructions. Per pt every 3 days    . LORazepam (ATIVAN) 1 MG tablet  Take 1 mg by mouth daily.    Marland Kitchen LORazepam (ATIVAN) 1 MG tablet Take 1 tablet (1 mg total) by mouth 3 (three) times daily for 3 days, THEN 1 tablet (1 mg total) 2 (two) times daily for 3 days, THEN 0.5 tablets (0.5 mg total) 2 (two) times daily for 3 days. 18 tablet 0    Musculoskeletal: Strength & Muscle Tone: within normal limits Gait & Station: normal Patient leans: N/A  Psychiatric Specialty Exam: Physical Exam Vitals and nursing note reviewed. Chaperone present: Sitter at bedside.  Constitutional:      General: She is not in acute distress.    Appearance: Normal appearance. She is not ill-appearing.  Cardiovascular:     Rate and Rhythm: Normal rate.  Pulmonary:     Effort: Pulmonary effort is normal.  Musculoskeletal:        General: Normal range of motion.     Cervical back: Normal range of motion.  Neurological:     Mental Status: She is alert and oriented to person, place, and time.  Psychiatric:        Attention and Perception: Attention and perception normal. She does not perceive auditory or visual hallucinations.        Mood and Affect: Mood and affect normal.        Speech: Speech normal.        Behavior: Behavior normal. Behavior is cooperative.        Thought Content: Thought content normal. Thought content is not paranoid or delusional. Thought content does not include homicidal or suicidal ideation.        Cognition and Memory: Cognition and memory normal.        Judgment: Judgment normal.     Review of Systems  Constitutional: Negative.   HENT: Negative.   Eyes: Negative.   Respiratory: Negative.   Cardiovascular: Negative.   Gastrointestinal: Negative.   Genitourinary: Negative.   Musculoskeletal: Negative.   Skin: Negative.   Neurological: Negative.   Hematological: Negative.   Psychiatric/Behavioral: Negative for agitation, confusion and sleep disturbance. Hallucinations: Denies. Self-injury: Denies. Suicidal ideas: Denies. The patient is not  nervous/anxious.        Patient states she is taking medication for anxiety and was drinking alcohol.  "I know I shouldn't have mixed the two; but it affected me and when my mother called to check on me I said some things that I shouldn't have and regret it now; but I was never trying to kill myself. "    Blood pressure (!) 138/97, pulse (!) 112, temperature 98.1 F (36.7 C), temperature source Oral, resp. rate 17, SpO2 97 %, unknown if currently breastfeeding.There is no height or weight on file to calculate BMI.  General Appearance: Casual  Eye Contact:  Good  Speech:  Clear and Coherent and Normal Rate  Volume:  Normal  Mood:  Anxious and Depressed  Affect:  Congruent  Thought Process:  Coherent, Goal Directed and Descriptions of Associations: Intact  Orientation:  Full (Time, Place, and Person)  Thought Content:  WDL  Suicidal Thoughts:  No  Homicidal Thoughts:  No  Memory:  Immediate;   Good Recent;   Good  Judgement:  Intact  Insight:  Present  Psychomotor Activity:  Normal  Concentration:  Concentration: Good and Attention Span: Good  Recall:  Good  Fund of Knowledge:  Good  Language:  Good  Akathisia:  No  Handed:  Right  AIMS (if indicated):     Assets:  Communication  Skills Desire for Improvement Housing Social Support  ADL's:  Intact  Cognition:  WNL  Sleep:      Treatment Plan Summary: Medication management and Plan Continuous Assessment overnight. Behavior health coordinator to assist with setting up with outpatient psychiatric services; referral to [facility]    Patient current on Ativan detox protocol.  No home medications.  Disposition: Supportive therapy provided about ongoing stressors. Refer to IOP. Monitor for safety and stabilization overnight.  Transfer to [facility] Continuous Assessment Unit  This service was provided via telemedicine using a 2-way, interactive audio and video technology.  Names of all persons participating in this telemedicine service  and their role in this encounter. Name: Assunta Found Role: NP  Name: Dr. Nelly Rout Role: Psychiatrist  Name: Tiffany Parsons Role: Patient  Name: Tiffany Parsons Role: Patients mother   Secure messaged sent to Dr. Particia Nearing informing:  Patient accepted to [facility] continuous assessment unit.  Behavioral health coordinator will set up with outpatient psychiatric services ([facility]).  Also ordered peer support to assist with rehab services.  Nursing can call report to get a time best for transfer.     Shuvon Rankin, NP 09/28/2020 12:16 PM

## 2020-09-28 NOTE — Discharge Instructions (Addendum)
Patient is instructed prior to discharge to: Take all medications as prescribed by his/her mental healthcare provider. Report any adverse effects and or reactions from the medicines to his/her outpatient provider promptly. Patient has been instructed & cautioned: To not engage in alcohol and or illegal drug use while on prescription medicines. In the event of worsening symptoms, patient is instructed to call the crisis hotline, 911 and or go to the nearest ED for appropriate evaluation and treatment of symptoms. To follow-up with his/her primary care provider for your other medical issues, concerns and or health care needs.     IPlease come to [facility] (this facility) during walk in hours for appointment with psychiatrist for further medication management and for therapy.   Walk in hours are 8-11 AM Monday through Thursday for medication management.It is first come, first -serve; it is best to arrive by 7:15 AM. On Friday from 1 pm to 4 pm for therapy intake only. Please arrive by 12:00 pm as it is  first come, first -serve.   When you arrive please go upstairs for your appointment. If you are unsure of where to go, inform the front desk that you are here for a walk in appointment and they will assist you with directions upstairs.  Address:  [address], in [place], [address] Ph: [phone]

## 2020-09-29 MED ORDER — GABAPENTIN 100 MG PO CAPS
200.0000 mg | ORAL_CAPSULE | Freq: Three times a day (TID) | ORAL | 0 refills | Status: AC
Start: 2020-09-29 — End: 2020-10-29

## 2020-09-29 NOTE — ED Notes (Signed)
Pt awake laying in bed reading book. Calm and cooperative. No new issues noted at this time. Will continue to monitor for safety

## 2020-09-29 NOTE — ED Notes (Signed)
Pt educated on avs and medications. Pt verbalized understanding. Escorted to retrieve belongings. Ambulated per self. No s/s pain, discomfort, or acute distress. No SI, HI, or AVH noted. A&O x4. Escorted to front lobby. Stable at time of d/c

## 2020-09-29 NOTE — ED Notes (Signed)
Pt sleeping@this time. Breathing even and unlabored. Will continue to monitor for safety 

## 2020-09-29 NOTE — ED Provider Notes (Signed)
FBC/OBS ASAP Discharge Summary  Date and Time: 09/29/2020 9:53 AM  Name: Tiffany Parsons  MRN:  [id number]   Discharge Diagnoses:  Final diagnoses:  Adjustment disorder with mixed anxiety and depressed mood  Alcohol use disorder, moderate, in controlled environment [facility])    Subjective: Patient interviewed this morning. She is calm, cooperative and pleasant. She recalls the circumstances that led her coming into the hospital as per H&P. She continues to deny SI/HI/AVH. She admits to drinking heavily prior to coming into the hospital but denies mixing alcohol with her "anxiety medication" (ativan). She states that prior to finding out her fiance was cheating on her, she had been sober for 2 months but that this event triggered her to relapse.She has denied SI since arrival to [facility] and continues to deny SI this morning. She states that she "loves my life" and cites her religion as a reason she would not kill herself.   Discussed with patient, that per previous collateral collected, she had reportedly been heavily drinking for 3 weeks. Pt denies this and maintains that she has only drank on one occasion, this bring prior to coming to the hospital, since sobriety. She requests letter for work. Patient to be discharged as she does not meet criteria for hospitalization.    Angie West at [phone] Called at 10:09 AM Discussed plan for discharge. States that she will pick up patient around 11:30. Discussed that at this time pt is not interested in substance use treatment but that resources will be provided in her discharge paper work should she change her mind.   Stay Summary:   Tiffany Parsons, 30 y.o., female patient presents to [facility] for direct admit to continuous assessment from [facility] ED under IVC petition by her mother with complaints of auditory hallucinations, intoxication, and suicidal ideation on 09/28/20.   Patient see earlier by this provider via tele health while at [facility] ED.    On evaluation Zonie Crutcher continues to report she is not suicidal and knows she shouldn't have drank alcohol while taking her Ativan.  Patient states that she wants to go home.  Patient made aware that she was under IVC petition and would need to monitor her over night.  Patient was admitted for observation and was started on gabapentin 100 mg TID for alcohol use disorder. The following morning (See above) patient continued to deny SI/HI/AVH. She does not meet criteria for inpatient admission and will be discharged. Suspect, that pt is minimizing her alcohol intake; she declines substance use treatment, but information will be provided for her prior to discharge should she change her mind.    Total Time spent with patient: 20 minutes  Past Psychiatric History: see H&P Past Medical History:  Past Medical History:  Diagnosis Date  . Bacterial infection   . Medical history non-contributory     Past Surgical History:  Procedure Laterality Date  . NO PAST SURGERIES    . WISDOM TOOTH EXTRACTION     Family History:  Family History  Problem Relation Age of Onset  . Hyperlipidemia Mother   . Kidney Stones Mother   . Thyroid disease Mother    Family Psychiatric History: see H&P Social History:  Social History   Substance and Sexual Activity  Alcohol Use Yes   Comment: 5 drinks; 2-3 times per week     Social History   Substance and Sexual Activity  Drug Use No    Social History   Socioeconomic History  . Marital status: Single  Spouse name: Not on file  . Number of children: Not on file  . Years of education: Not on file  . Highest education level: Not on file  Occupational History  . Not on file  Tobacco Use  . Smoking status: Former Games developer  . Smokeless tobacco: Never Used  . Tobacco comment: smoked lightly in the past  Vaping Use  . Vaping Use: Never used  Substance and Sexual Activity  . Alcohol use: Yes    Comment: 5 drinks; 2-3 times per week  . Drug use: No  . Sexual activity: Yes   Other Topics Concern  . Not on file  Social History Narrative   Work or School: works at [employer]; [employer] public health      Home Situation: lives with [employer]      Spiritual Beliefs: Christian      Lifestyle: walks a lot; diet is healthy            Social Determinants of Corporate investment banker Strain: Not on file  Food Insecurity: Not on file  Transportation Needs: Not on file  Physical Activity: Not on file  Stress: Not on file  Social Connections: Not on file   SDOH:  SDOH Screenings   Alcohol Screen: Not on file  Depression (PHQ2-9): Not on file  Financial Resource Strain: Not on file  Food Insecurity: Not on file  Housing: Not on file  Physical Activity: Not on file  Social Connections: Not on file  Stress: Not on file  Tobacco Use: Medium Risk  . Smoking Tobacco Use: Former Smoker  . Smokeless Tobacco Use: Never Used  Transportation Needs: Not on file    Has this patient used any form of tobacco in the last 30 days? (Cigarettes, Smokeless Tobacco, Cigars, and/or Pipes) Prescription not provided because: n/a  Current Medications:  Current Facility-Administered Medications  Medication Dose Route Frequency Provider Last Rate Last Admin  . acetaminophen (TYLENOL) tablet 650 mg  650 mg Oral Q6H PRN Rankin, Shuvon B, NP      . alum & mag hydroxide-simeth (MAALOX/MYLANTA) 200-200-20 MG/5ML suspension 30 mL  30 mL Oral Q4H PRN Rankin, Shuvon B, NP      . gabapentin (NEURONTIN) capsule 200 mg  200 mg Oral TID Rankin, Shuvon B, NP   200 mg at 09/29/20 0933  . hydrOXYzine (ATARAX/VISTARIL) tablet 25 mg  25 mg Oral TID PRN Rankin, Shuvon B, NP   25 mg at 09/28/20 2014  . magnesium hydroxide (MILK OF MAGNESIA) suspension 30 mL  30 mL Oral Daily PRN Rankin, Shuvon B, NP      . traZODone (DESYREL) tablet 50 mg  50 mg Oral QHS PRN Rankin, Shuvon B, NP   50 mg at 09/28/20 2014   Current Outpatient Medications  Medication Sig Dispense Refill  . gabapentin  (NEURONTIN) 100 MG capsule Take 2 capsules (200 mg total) by mouth 3 (three) times daily. 180 capsule 0    PTA Medications: (Not in a hospital admission)   Musculoskeletal  Strength & Muscle Tone: within normal limits Gait & Station: normal Patient leans: N/A  Psychiatric Specialty Exam  Presentation  General Appearance: Appropriate for Environment; Casual; Fairly Groomed  Eye Contact:Good  Speech:Clear and Coherent; Normal Rate  Speech Volume:Normal  Handedness:Right   Mood and Affect  Mood:Euthymic  Affect:Appropriate; Congruent   Thought Process  Thought Processes:Coherent; Goal Directed; Linear  Descriptions of Associations:Intact  Orientation:Full (Time, Place and Person)  Thought Content:WDL  Hallucinations:Hallucinations: None  Ideas of Reference:None  Suicidal Thoughts:Suicidal Thoughts: No  Homicidal Thoughts:Homicidal Thoughts: No   Sensorium  Memory:Immediate Good; Recent Good; Remote Fair  Judgment:Fair  Insight:Fair   Executive Functions  Concentration:Good  Attention Span:Good  Recall:Good  Fund of Knowledge:Good  Language:Good   Psychomotor Activity  Psychomotor Activity:Psychomotor Activity: Normal   Assets  Assets:Communication Skills; Desire for Improvement; Resilience; Housing   Sleep  Sleep:Sleep: Fair   Physical Exam  Physical Exam Constitutional:      Appearance: Normal appearance. She is normal weight.  HENT:     Head: Normocephalic and atraumatic.  Eyes:     Extraocular Movements: Extraocular movements intact.  Pulmonary:     Effort: Pulmonary effort is normal.  Neurological:     Mental Status: She is alert.    Review of Systems  Constitutional: Negative.   Respiratory: Negative.   Cardiovascular: Negative.   Gastrointestinal: Negative.   Musculoskeletal: Negative.   Neurological: Negative.   Psychiatric/Behavioral: Positive for substance abuse. Negative for depression, hallucinations and  suicidal ideas.   Blood pressure (!) 133/97, pulse 85, temperature 98.3 F (36.8 C), temperature source Oral, resp. rate 18, height 5\' 7"  (1.702 m), weight 59.9 kg, SpO2 100 %, unknown if currently breastfeeding. Body mass index is 20.67 kg/m.  Demographic Factors:  Caucasian and Living alone  Loss Factors: Loss of significant relationship  Historical Factors: Impulsivity  Risk Reduction Factors:   Religious beliefs about death, Employed and Positive social support  Continued Clinical Symptoms:  Alcohol/Substance Abuse/Dependencies  Cognitive Features That Contribute To Risk:  Minimization of substance use  Suicide Risk:  Minimal: No identifiable suicidal ideation.  Patients presenting with no risk factors but with morbid ruminations; may be classified as minimal risk based on the severity of the depressive symptoms  Plan Of Care/Follow-up recommendations:  Activity:  as tolerated Diet:  regular Other:     Patient is instructed prior to discharge to: Take all medications as prescribed by his/her mental healthcare provider. Report any adverse effects and or reactions from the medicines to his/her outpatient provider promptly. Patient has been instructed & cautioned: To not engage in alcohol and or illegal drug use while on prescription medicines. In the event of worsening symptoms, patient is instructed to call the crisis hotline, 911 and or go to the nearest ED for appropriate evaluation and treatment of symptoms. To follow-up with his/her primary care provider for your other medical issues, concerns and or health care needs.    Provided information about open access hours at the [facility] in [facility]. Substance use resources provided in attached instructions from [facility]  Disposition: home/self care  [facility], MD 09/29/2020, 9:53 AM

## 2020-10-07 ENCOUNTER — Telehealth (HOSPITAL_COMMUNITY): Payer: Self-pay | Admitting: General Practice

## 2020-10-07 NOTE — Telephone Encounter (Signed)
Care Management - Follow Up [facility] Discharges   Writer attempted to make contact with patient today and was unsuccessful.  Writer was not able to leave a message.  Patient phone is disconnected.

## 2020-11-06 ENCOUNTER — Emergency Department (HOSPITAL_COMMUNITY)
Admission: EM | Admit: 2020-11-06 | Discharge: 2020-11-07 | Disposition: A | Discharge status: Home / Self Care | Payer: No Typology Code available for payment source | Attending: Emergency Medicine | Admitting: Emergency Medicine

## 2020-11-06 ENCOUNTER — Emergency Department (HOSPITAL_COMMUNITY): Payer: No Typology Code available for payment source

## 2020-11-06 ENCOUNTER — Other Ambulatory Visit: Payer: Self-pay

## 2020-11-06 DIAGNOSIS — Z20822 Contact with and (suspected) exposure to covid-19: Secondary | ICD-10-CM | POA: Diagnosis not present

## 2020-11-06 DIAGNOSIS — R0682 Tachypnea, not elsewhere classified: Secondary | ICD-10-CM | POA: Insufficient documentation

## 2020-11-06 DIAGNOSIS — Y92410 Unspecified street and highway as the place of occurrence of the external cause: Secondary | ICD-10-CM | POA: Insufficient documentation

## 2020-11-06 DIAGNOSIS — R000 Tachycardia, unspecified: Secondary | ICD-10-CM | POA: Insufficient documentation

## 2020-11-06 DIAGNOSIS — R079 Chest pain, unspecified: Secondary | ICD-10-CM

## 2020-11-06 DIAGNOSIS — H5789 Other specified disorders of eye and adnexa: Secondary | ICD-10-CM | POA: Insufficient documentation

## 2020-11-06 DIAGNOSIS — S42018A Nondisplaced fracture of sternal end of left clavicle, initial encounter for closed fracture: Secondary | ICD-10-CM | POA: Diagnosis not present

## 2020-11-06 DIAGNOSIS — S4992XA Unspecified injury of left shoulder and upper arm, initial encounter: Secondary | ICD-10-CM | POA: Diagnosis present

## 2020-11-06 DIAGNOSIS — F10920 Alcohol use, unspecified with intoxication, uncomplicated: Secondary | ICD-10-CM | POA: Diagnosis not present

## 2020-11-06 LAB — COMPREHENSIVE METABOLIC PANEL
ALT: 31 U/L (ref 0–44)
AST: 100 U/L — ABNORMAL HIGH (ref 15–41)
Albumin: 4.7 g/dL (ref 3.5–5.0)
Alkaline Phosphatase: 62 U/L (ref 38–126)
Anion gap: 13 (ref 5–15)
BUN: <5 mg/dL — ABNORMAL LOW (ref 6–20)
CO2: 22 mmol/L (ref 22–32)
Calcium: 8.8 mg/dL — ABNORMAL LOW (ref 8.9–10.3)
Chloride: 102 mmol/L (ref 98–111)
Creatinine, Ser: 0.54 mg/dL (ref 0.44–1.00)
GFR, Estimated: >60 mL/min (ref >60)
Glucose, Bld: 107 mg/dL — ABNORMAL HIGH (ref 70–99)
Potassium: 4 mmol/L (ref 3.5–5.1)
Sodium: 137 mmol/L (ref 135–145)
Total Bilirubin: 0.9 mg/dL (ref 0.3–1.2)
Total Protein: 7.4 g/dL (ref 6.5–8.1)

## 2020-11-06 LAB — I-STAT BETA HCG BLOOD, ED (MC, WL, AP ONLY): I-stat hCG, quantitative: <5 m[IU]/mL (ref <5)

## 2020-11-06 LAB — URINALYSIS, ROUTINE W REFLEX MICROSCOPIC
Bacteria, UA: NONE SEEN
Bilirubin Urine: NEGATIVE
Glucose, UA: NEGATIVE mg/dL
Hgb urine dipstick: NEGATIVE
Ketones, ur: NEGATIVE mg/dL
Leukocytes,Ua: NEGATIVE
Nitrite: NEGATIVE
Protein, ur: NEGATIVE mg/dL
Specific Gravity, Urine: 1.029 (ref 1.005–1.030)
pH: 6 (ref 5.0–8.0)

## 2020-11-06 LAB — RESP PANEL BY RT-PCR (FLU A&B, COVID) ARPGX2
Influenza A by PCR: NEGATIVE
Influenza B by PCR: NEGATIVE
SARS Coronavirus 2 by RT PCR: NEGATIVE

## 2020-11-06 LAB — I-STAT CHEM 8, ED
BUN: <3 mg/dL — ABNORMAL LOW (ref 6–20)
Calcium, Ion: 1.05 mmol/L — ABNORMAL LOW (ref 1.15–1.40)
Chloride: 103 mmol/L (ref 98–111)
Creatinine, Ser: 1.1 mg/dL — ABNORMAL HIGH (ref 0.44–1.00)
Glucose, Bld: 104 mg/dL — ABNORMAL HIGH (ref 70–99)
HCT: 42 % (ref 36.0–46.0)
Hemoglobin: 14.3 g/dL (ref 12.0–15.0)
Potassium: 3.7 mmol/L (ref 3.5–5.1)
Sodium: 139 mmol/L (ref 135–145)
TCO2: 25 mmol/L (ref 22–32)

## 2020-11-06 LAB — CBC WITH DIFFERENTIAL/PLATELET
Abs Immature Granulocytes: 0.03 10*3/uL (ref 0.00–0.07)
Basophils Absolute: 0.1 10*3/uL (ref 0.0–0.1)
Basophils Relative: 1 %
Eosinophils Absolute: 0 10*3/uL (ref 0.0–0.5)
Eosinophils Relative: 0 %
HCT: 43.2 % (ref 36.0–46.0)
Hemoglobin: 14.8 g/dL (ref 12.0–15.0)
Immature Granulocytes: 0 %
Lymphocytes Relative: 17 %
Lymphs Abs: 1.6 10*3/uL (ref 0.7–4.0)
MCH: 36.5 pg — ABNORMAL HIGH (ref 26.0–34.0)
MCHC: 34.3 g/dL (ref 30.0–36.0)
MCV: 106.7 fL — ABNORMAL HIGH (ref 80.0–100.0)
Monocytes Absolute: 0.6 10*3/uL (ref 0.1–1.0)
Monocytes Relative: 6 %
Neutro Abs: 7.3 10*3/uL (ref 1.7–7.7)
Neutrophils Relative %: 76 %
Platelets: 248 10*3/uL (ref 150–400)
RBC: 4.05 MIL/uL (ref 3.87–5.11)
RDW: 14.4 % (ref 11.5–15.5)
WBC: 9.6 10*3/uL (ref 4.0–10.5)
nRBC: 0 % (ref 0.0–0.2)

## 2020-11-06 LAB — ETHANOL: Alcohol, Ethyl (B): 412 mg/dL (ref <10)

## 2020-11-06 LAB — LACTIC ACID, PLASMA: Lactic Acid, Venous: 2.7 mmol/L (ref 0.5–1.9)

## 2020-11-06 LAB — SAMPLE TO BLOOD BANK

## 2020-11-06 LAB — PROTIME-INR
INR: 1 (ref 0.8–1.2)
Prothrombin Time: 12.8 seconds (ref 11.4–15.2)

## 2020-11-06 MED ORDER — FENTANYL CITRATE (PF) 100 MCG/2ML IJ SOLN
INTRAMUSCULAR | Status: AC
  Filled 2020-11-06: qty 2

## 2020-11-06 MED ORDER — FENTANYL CITRATE (PF) 100 MCG/2ML IJ SOLN
50.0000 ug | Freq: Once | INTRAMUSCULAR | Status: AC
Start: 2020-11-06 — End: 2020-11-06
  Administered 2020-11-06: 50 ug via INTRAVENOUS

## 2020-11-06 MED ORDER — TRAMADOL HCL 50 MG PO TABS: 50.0000 mg | ORAL_TABLET | Freq: Four times a day (QID) | ORAL | 0 refills | Status: AC | PRN

## 2020-11-06 MED ORDER — FENTANYL CITRATE (PF) 100 MCG/2ML IJ SOLN
50.0000 ug | Freq: Once | INTRAMUSCULAR | Status: AC
  Administered 2020-11-06: 50 ug via INTRAVENOUS
  Filled 2020-11-06: qty 2

## 2020-11-06 MED ORDER — ONDANSETRON HCL 4 MG/2ML IJ SOLN
4.0000 mg | Freq: Once | INTRAMUSCULAR | Status: AC
  Administered 2020-11-06: 4 mg via INTRAVENOUS

## 2020-11-06 MED ORDER — IOHEXOL 300 MG/ML  SOLN
100.0000 mL | Freq: Once | INTRAMUSCULAR | Status: AC | PRN
  Administered 2020-11-06: 100 mL via INTRAVENOUS

## 2020-11-06 MED ORDER — SODIUM CHLORIDE 0.9 % IV BOLUS: 125.0000 mL | Freq: Once | INTRAVENOUS | Status: DC

## 2020-11-06 MED ORDER — ONDANSETRON HCL 4 MG/2ML IJ SOLN: 4.0000 mg | Freq: Once | INTRAMUSCULAR | Status: DC

## 2020-11-06 MED ORDER — ONDANSETRON HCL 4 MG/2ML IJ SOLN
INTRAMUSCULAR | Status: AC
  Filled 2020-11-06: qty 2

## 2020-11-06 MED ORDER — SODIUM CHLORIDE 0.9 % IV SOLN: Freq: Once | INTRAVENOUS | Status: AC

## 2020-11-06 MED ORDER — KETOROLAC TROMETHAMINE 15 MG/ML IJ SOLN
15.0000 mg | Freq: Once | INTRAMUSCULAR | Status: AC
  Administered 2020-11-06: 15 mg via INTRAVENOUS
  Filled 2020-11-06: qty 1

## 2020-11-06 NOTE — Discharge Instructions (Addendum)
Wear the sling at all times except when you are bathing.  No lifting with the left arm until cleared by the orthopedist.  You are to be very sore over the next few days and will need to use ibuprofen.  You will be given a short course of pain medication.  It will be important to not mix this with alcohol.

## 2020-11-06 NOTE — Progress Notes (Signed)
Orthopedic Tech Progress Note Patient Details:  Equilla Que [birth year] [id number]  Ortho Devices Type of Ortho Device: Arm sling Ortho Device/Splint Location: lue Ortho Device/Splint Interventions: Ordered,Application,Adjustment   Post Interventions Patient Tolerated: Well Instructions Provided: Care of device,Adjustment of device   Trinna Post 11/06/2020, 7:43 PM

## 2020-11-06 NOTE — ED Provider Notes (Signed)
Care of the patient assumed at signout.  On my initial evaluation the patient she continues to be drowsy, complaining of pain in the left shoulder.  She is accompanied by her mother.   Physical Exam  BP 112/79   Pulse 95   Temp 97.6 F (36.4 C) (Oral)   Resp 18   Ht 5\' 7"  (1.702 m)   Wt 61.2 kg   SpO2 98%   BMI 21.14 kg/m    10:57 PM Patient accompanied by her father.  She is much more awake than on arrival, possibly due to the patient's alcohol level of 400 when she arrived.  She is now interactive appropriately, pleasant.  On we discussed need to follow-up with orthopedics, she remains hemodynamically unremarkable, patient discharged in stable condition.   , MD 11/06/20 2257

## 2020-11-06 NOTE — Progress Notes (Addendum)
   11/06/20 1112  Clinical Encounter Type  Visited With Patient not available  Visit Type Trauma  Referral From Nurse  Consult/Referral To Chaplain   Chaplain responded to Level 2 trauma. Per Pt and nurse's request, Chaplain attempted to contact Pt's father, Tiffany Parsons at [phone]. There was no answer. Chaplain engaged active listening and provided emotional and physical support.   1204 - Chaplain called Graylon Good again at the above number with no answer.  Chaplain remains available.   This note was prepared by Chaplain Resident, Tacy Learn, MDiv. Chaplain remains available as needed through the on-call pager: [phone].

## 2020-11-06 NOTE — ED Notes (Signed)
This RN provided pt a [place] Sandwich and a Gingerale and encouraged pt to eat.

## 2020-11-06 NOTE — Progress Notes (Signed)
Orthopedic Tech Progress Note Patient Details:  Tiffany Parsons 1990-08-17 [id number] Level two trauma. Not currently needed Patient ID: [name], female   DOB: [birth year], 30 y.o.   MRN: [id number]   [name] 11/06/2020, 12:48 PM
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
## 2020-11-06 NOTE — ED Notes (Signed)
Aspen c-collar placed 

## 2020-11-07 MED ORDER — ONDANSETRON HCL 4 MG/2ML IJ SOLN
4.0000 mg | Freq: Once | INTRAMUSCULAR | Status: AC
  Administered 2020-11-07: 4 mg via INTRAVENOUS
  Filled 2020-11-07: qty 2

## 2020-11-07 MED ORDER — SODIUM CHLORIDE 0.9 % IV BOLUS
1000.0000 mL | Freq: Once | INTRAVENOUS | Status: AC
  Administered 2020-11-07: 1000 mL via INTRAVENOUS

## 2020-11-07 NOTE — ED Notes (Signed)
Attempted to get pt ready for discharge. Pt began throwing up and complaining of pain. Pt was unable to stand. EDP made aware.

## 2020-11-15 ENCOUNTER — Other Ambulatory Visit: Payer: Self-pay | Admitting: Orthopedic Surgery

## 2020-11-16 ENCOUNTER — Other Ambulatory Visit: Payer: Self-pay | Admitting: Orthopedic Surgery

## 2020-11-16 DIAGNOSIS — S42002A Fracture of unspecified part of left clavicle, initial encounter for closed fracture: Secondary | ICD-10-CM

## 2020-11-16 DIAGNOSIS — S42001A Fracture of unspecified part of right clavicle, initial encounter for closed fracture: Secondary | ICD-10-CM

## 2021-09-14 ENCOUNTER — Emergency Department (HOSPITAL_BASED_OUTPATIENT_CLINIC_OR_DEPARTMENT_OTHER)
Admission: EM | Admit: 2021-09-14 | Discharge: 2021-09-14 | Disposition: A | Discharge status: Home / Self Care | Payer: Self-pay | Attending: Emergency Medicine | Admitting: Emergency Medicine

## 2021-09-14 ENCOUNTER — Encounter (HOSPITAL_BASED_OUTPATIENT_CLINIC_OR_DEPARTMENT_OTHER): Payer: Self-pay | Admitting: Emergency Medicine

## 2021-09-14 DIAGNOSIS — R112 Nausea with vomiting, unspecified: Secondary | ICD-10-CM | POA: Insufficient documentation

## 2021-09-14 DIAGNOSIS — T7421XA Adult sexual abuse, confirmed, initial encounter: Secondary | ICD-10-CM | POA: Insufficient documentation

## 2021-09-14 DIAGNOSIS — R109 Unspecified abdominal pain: Secondary | ICD-10-CM | POA: Insufficient documentation

## 2021-09-14 LAB — CBC WITH DIFFERENTIAL/PLATELET
Abs Immature Granulocytes: 0.01 10*3/uL (ref 0.00–0.07)
Basophils Absolute: 0.1 10*3/uL (ref 0.0–0.1)
Basophils Relative: 1 %
Eosinophils Absolute: 0.1 10*3/uL (ref 0.0–0.5)
Eosinophils Relative: 2 %
HCT: 39 % (ref 36.0–46.0)
Hemoglobin: 13.6 g/dL (ref 12.0–15.0)
Immature Granulocytes: 0 %
Lymphocytes Relative: 55 %
Lymphs Abs: 2.2 10*3/uL (ref 0.7–4.0)
MCH: 34.9 pg — ABNORMAL HIGH (ref 26.0–34.0)
MCHC: 34.9 g/dL (ref 30.0–36.0)
MCV: 100 fL (ref 80.0–100.0)
Monocytes Absolute: 0.4 10*3/uL (ref 0.1–1.0)
Monocytes Relative: 9 %
Neutro Abs: 1.3 10*3/uL — ABNORMAL LOW (ref 1.7–7.7)
Neutrophils Relative %: 33 %
Platelets: 231 10*3/uL (ref 150–400)
RBC: 3.9 MIL/uL (ref 3.87–5.11)
RDW: 12.4 % (ref 11.5–15.5)
WBC: 4 10*3/uL (ref 4.0–10.5)
nRBC: 0 % (ref 0.0–0.2)

## 2021-09-14 LAB — URINALYSIS, ROUTINE W REFLEX MICROSCOPIC
Bilirubin Urine: NEGATIVE
Glucose, UA: NEGATIVE mg/dL
Ketones, ur: NEGATIVE mg/dL
Nitrite: NEGATIVE
Protein, ur: NEGATIVE mg/dL
Specific Gravity, Urine: 1.015 (ref 1.005–1.030)
pH: 7.5 (ref 5.0–8.0)

## 2021-09-14 LAB — COMPREHENSIVE METABOLIC PANEL
ALT: 25 U/L (ref 0–44)
AST: 54 U/L — ABNORMAL HIGH (ref 15–41)
Albumin: 4.5 g/dL (ref 3.5–5.0)
Alkaline Phosphatase: 51 U/L (ref 38–126)
Anion gap: 14 (ref 5–15)
BUN: 8 mg/dL (ref 6–20)
CO2: 23 mmol/L (ref 22–32)
Calcium: 8.5 mg/dL — ABNORMAL LOW (ref 8.9–10.3)
Chloride: 100 mmol/L (ref 98–111)
Creatinine, Ser: 0.52 mg/dL (ref 0.44–1.00)
GFR, Estimated: >60 mL/min (ref >60)
Glucose, Bld: 110 mg/dL — ABNORMAL HIGH (ref 70–99)
Potassium: 3.4 mmol/L — ABNORMAL LOW (ref 3.5–5.1)
Sodium: 137 mmol/L (ref 135–145)
Total Bilirubin: 0.7 mg/dL (ref 0.3–1.2)
Total Protein: 7.5 g/dL (ref 6.5–8.1)

## 2021-09-14 LAB — URINALYSIS, MICROSCOPIC (REFLEX)

## 2021-09-14 LAB — PREGNANCY, URINE: Preg Test, Ur: NEGATIVE

## 2021-09-14 MED ORDER — ALUM & MAG HYDROXIDE-SIMETH 200-200-20 MG/5ML PO SUSP
30.0000 mL | Freq: Once | ORAL | Status: AC
  Administered 2021-09-14: 30 mL via ORAL
  Filled 2021-09-14: qty 30

## 2021-09-14 MED ORDER — LIDOCAINE VISCOUS HCL 2 % MT SOLN
15.0000 mL | Freq: Once | OROMUCOSAL | Status: AC
  Administered 2021-09-14: 15 mL via ORAL
  Filled 2021-09-14: qty 15

## 2021-09-14 MED ORDER — SODIUM CHLORIDE 0.9 % IV BOLUS
500.0000 mL | Freq: Once | INTRAVENOUS | Status: AC
Start: 2021-09-14 — End: 2021-09-14
  Administered 2021-09-14: 500 mL via INTRAVENOUS

## 2021-09-14 MED ORDER — ONDANSETRON 4 MG PO TBDP
8.0000 mg | ORAL_TABLET | Freq: Once | ORAL | Status: AC
  Administered 2021-09-14: 8 mg via ORAL
  Filled 2021-09-14: qty 2

## 2021-09-14 MED ORDER — ONDANSETRON 8 MG PO TBDP: ORAL_TABLET | ORAL | 0 refills | Status: AC

## 2021-09-14 NOTE — ED Triage Notes (Signed)
Pt in via [facility] with c/o generalized abdominal pain, n/v/d x 3 days.
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
## 2021-09-14 NOTE — ED Notes (Signed)
Patient reports that her step-father is coming to pick her up.  Patient provided with discharge teaching and paperwork.  Instructed to keep herself well hydrated

## 2021-09-14 NOTE — ED Notes (Signed)
Attempting to d/c patient.  Patient reports she feels worse and would like to speak to MD prior to d/c

## 2021-09-14 NOTE — ED Notes (Signed)
Went to check on patient and pt was not in room. Night shift had been attempting to discharge pt and had already gone over discharge instructions per note.

## 2021-09-14 NOTE — ED Notes (Signed)
Pt heard vomiting loudly from room. When this RN went in to assess, pt was hanging over bedrail, small amount of clear vomit in floor. PO zofran given at this time, MD also notified.

## 2021-09-14 NOTE — ED Notes (Signed)
Attempted to discharge patient x 2.  Patient now reports she feels "like I am detoxing from alcohol.  I feel horrible.  My heart is racing."  Patient states "I don't want to be alone right now."  Encouraged patient to call mother for discharge.  Charge RN made aware

## 2021-09-14 NOTE — ED Notes (Signed)
Accompanied EDP to room to readdress consulting the sane nurse   EDP and nurse assigned to pt earlier had discussed this option with pt earlier   EDP and this recorder sat down in room with pt explained options in depth with pt   Pt agreed for SANE nurse to be called
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
## 2023-06-20 ENCOUNTER — Ambulatory Visit (INDEPENDENT_AMBULATORY_CARE_PROVIDER_SITE_OTHER): Payer: MEDICAID | Admitting: Family Medicine

## 2023-06-20 ENCOUNTER — Encounter: Payer: Self-pay | Admitting: Family Medicine

## 2023-06-20 ENCOUNTER — Other Ambulatory Visit (HOSPITAL_COMMUNITY)
Admission: RE | Admit: 2023-06-20 | Discharge: 2023-06-20 | Disposition: A | Discharge status: Home / Self Care | Payer: MEDICAID | Source: Ambulatory Visit | Attending: Family Medicine | Admitting: Family Medicine

## 2023-06-20 VITALS — BP 120/78 | HR 83 | Ht 67.0 in | Wt 161.0 lb

## 2023-06-20 DIAGNOSIS — Z01419 Encounter for gynecological examination (general) (routine) without abnormal findings: Secondary | ICD-10-CM | POA: Insufficient documentation

## 2023-06-20 NOTE — Progress Notes (Signed)
   ANNUAL EXAM Patient name: Tiffany Parsons MRN [id number]  Date of birth: 13-Dec-1990 Chief Complaint:   Annual Exam  History of Present Illness:   Tiffany Parsons is a 32 y.o.  [id number]  female  being seen today for a routine annual exam.  Current complaints: none. Normal interval of periods. Sexually active with boyfriend. Sober since May.  Patient's last menstrual period was 06/04/2023 (approximate).    Last pap uncertain - thinks when she was 19-21. Results were:  unsure . H/O abnormal pap: no Last mammogram: n/a.       No data to display               No data to display           Review of Systems:   Pertinent items are noted in HPI Denies any headaches, blurred vision, fatigue, shortness of breath, chest pain, abdominal pain, abnormal vaginal discharge/itching/odor/irritation, problems with periods, bowel movements, urination, or intercourse unless otherwise stated above. Pertinent History Reviewed:  Reviewed past medical,surgical, social and family history.  Reviewed problem list, medications and allergies. Physical Assessment:   Vitals:   06/20/23 0925  BP: 120/78  Pulse: 83  Weight: 161 lb (73 kg)  Height: 5\' 7"  (1.702 m)  Body mass index is 25.22 kg/m.        Physical Examination:   General appearance - well appearing, and in no distress  Mental status - alert, oriented to person, place, and time  Psych:  She has a normal mood and affect  Skin - warm and dry, normal color, no suspicious lesions noted  Chest - effort normal, all lung fields clear to auscultation bilaterally  Heart - normal rate and regular rhythm  Neck:  midline trachea, no thyromegaly or nodules  Breasts - breasts appear normal, no suspicious masses, no skin or nipple changes or axillary nodes  Abdomen - soft, nontender, nondistended, no masses or organomegaly  Pelvic - VULVA: normal appearing vulva with no masses, tenderness or lesions  VAGINA: normal appearing vagina with normal color  and discharge, no lesions  CERVIX: normal appearing cervix without discharge or lesions, no CMT  Thin prep pap is done with HR HPV cotesting  UTERUS: uterus is felt to be normal size, shape, consistency and nontender   ADNEXA: No adnexal masses or tenderness noted.  Extremities:  No swelling or varicosities noted  Chaperone present for exam  Assessment & Plan:  1. Well woman exam with routine gynecological exam - Cytology - PAP - Hepatitis B surface antigen - Hepatitis C antibody - HIV Antibody (routine testing w rflx) - RPR   Orders Placed This Encounter  Procedures   Hepatitis B surface antigen   Hepatitis C antibody   HIV Antibody (routine testing w rflx)   RPR    Meds: No orders of the defined types were placed in this encounter.   Follow-up: No follow-ups on file.  Levie Heritage, DO 06/20/2023 10:28 AM

## 2023-06-21 LAB — CYTOLOGY - PAP
Adequacy: ABSENT
Chlamydia: NEGATIVE
Comment: NEGATIVE
Comment: NEGATIVE
Comment: NEGATIVE
Comment: NORMAL
Diagnosis: NEGATIVE
High risk HPV: NEGATIVE
Neisseria Gonorrhea: NEGATIVE
Trichomonas: NEGATIVE

## 2023-06-21 LAB — RPR: RPR Ser Ql: NONREACTIVE

## 2023-06-21 LAB — HEPATITIS B SURFACE ANTIGEN: Hepatitis B Surface Ag: NEGATIVE

## 2023-06-21 LAB — HEPATITIS C ANTIBODY: Hep C Virus Ab: NONREACTIVE

## 2023-06-21 LAB — HIV ANTIBODY (ROUTINE TESTING W REFLEX): HIV Screen 4th Generation wRfx: NONREACTIVE
